# Patient Record
Sex: Female | Born: 1950 | Race: White | Hispanic: No | Marital: Single | Smoking: Never smoker
Health system: Southern US, Community
[De-identification: ages and names within clinical notes are randomized; demographics above are authoritative.]

## PROBLEM LIST (undated history)

## (undated) DIAGNOSIS — K222 Esophageal obstruction: Secondary | ICD-10-CM

## (undated) DIAGNOSIS — I10 Essential (primary) hypertension: Secondary | ICD-10-CM

## (undated) DIAGNOSIS — M1990 Unspecified osteoarthritis, unspecified site: Secondary | ICD-10-CM

## (undated) DIAGNOSIS — E785 Hyperlipidemia, unspecified: Secondary | ICD-10-CM

## (undated) HISTORY — PX: APPENDECTOMY: SHX54

## (undated) HISTORY — PX: OTHER SURGICAL HISTORY: SHX169

## (undated) HISTORY — PX: ABDOMINAL HYSTERECTOMY: SHX81

---

## 2019-05-18 ENCOUNTER — Emergency Department (HOSPITAL_COMMUNITY)
Admission: EM | Admit: 2019-05-18 | Discharge: 2019-05-18 | Disposition: A | Discharge status: Home / Self Care | Payer: Medicare Other | Attending: Emergency Medicine | Admitting: Emergency Medicine

## 2019-05-18 ENCOUNTER — Other Ambulatory Visit: Payer: Self-pay

## 2019-05-18 ENCOUNTER — Emergency Department (HOSPITAL_COMMUNITY): Payer: Medicare Other

## 2019-05-18 ENCOUNTER — Encounter (HOSPITAL_COMMUNITY): Payer: Self-pay

## 2019-05-18 DIAGNOSIS — Y998 Other external cause status: Secondary | ICD-10-CM | POA: Diagnosis not present

## 2019-05-18 DIAGNOSIS — Y93I9 Activity, other involving external motion: Secondary | ICD-10-CM | POA: Insufficient documentation

## 2019-05-18 DIAGNOSIS — I10 Essential (primary) hypertension: Secondary | ICD-10-CM | POA: Insufficient documentation

## 2019-05-18 DIAGNOSIS — Y92410 Unspecified street and highway as the place of occurrence of the external cause: Secondary | ICD-10-CM | POA: Insufficient documentation

## 2019-05-18 DIAGNOSIS — N2889 Other specified disorders of kidney and ureter: Secondary | ICD-10-CM | POA: Insufficient documentation

## 2019-05-18 DIAGNOSIS — S8992XA Unspecified injury of left lower leg, initial encounter: Secondary | ICD-10-CM | POA: Insufficient documentation

## 2019-05-18 DIAGNOSIS — S79912A Unspecified injury of left hip, initial encounter: Secondary | ICD-10-CM | POA: Diagnosis present

## 2019-05-18 DIAGNOSIS — R0789 Other chest pain: Secondary | ICD-10-CM | POA: Diagnosis not present

## 2019-05-18 DIAGNOSIS — E785 Hyperlipidemia, unspecified: Secondary | ICD-10-CM | POA: Insufficient documentation

## 2019-05-18 HISTORY — DX: Hyperlipidemia, unspecified: E78.5

## 2019-05-18 HISTORY — DX: Essential (primary) hypertension: I10

## 2019-05-18 HISTORY — DX: Unspecified osteoarthritis, unspecified site: M19.90

## 2019-05-18 LAB — BASIC METABOLIC PANEL
Anion gap: 11 (ref 5–15)
BUN: 20 mg/dL (ref 8–23)
CO2: 24 mmol/L (ref 22–32)
Calcium: 9.5 mg/dL (ref 8.9–10.3)
Chloride: 105 mmol/L (ref 98–111)
Creatinine, Ser: 0.88 mg/dL (ref 0.44–1.00)
GFR calc Af Amer: >60 mL/min (ref >60)
GFR calc non Af Amer: >60 mL/min (ref >60)
Glucose, Bld: 119 mg/dL — ABNORMAL HIGH (ref 70–99)
Potassium: 3.5 mmol/L (ref 3.5–5.1)
Sodium: 140 mmol/L (ref 135–145)

## 2019-05-18 LAB — CBC WITH DIFFERENTIAL/PLATELET
Abs Immature Granulocytes: 0.11 10*3/uL — ABNORMAL HIGH (ref 0.00–0.07)
Basophils Absolute: 0.1 10*3/uL (ref 0.0–0.1)
Basophils Relative: 1 %
Eosinophils Absolute: 0.1 10*3/uL (ref 0.0–0.5)
Eosinophils Relative: 1 %
HCT: 40.4 % (ref 36.0–46.0)
Hemoglobin: 12.9 g/dL (ref 12.0–15.0)
Immature Granulocytes: 1 %
Lymphocytes Relative: 14 %
Lymphs Abs: 1.8 10*3/uL (ref 0.7–4.0)
MCH: 30.2 pg (ref 26.0–34.0)
MCHC: 31.9 g/dL (ref 30.0–36.0)
MCV: 94.6 fL (ref 80.0–100.0)
Monocytes Absolute: 0.9 10*3/uL (ref 0.1–1.0)
Monocytes Relative: 6 %
Neutro Abs: 10.3 10*3/uL — ABNORMAL HIGH (ref 1.7–7.7)
Neutrophils Relative %: 77 %
Platelets: 288 10*3/uL (ref 150–400)
RBC: 4.27 MIL/uL (ref 3.87–5.11)
RDW: 13.3 % (ref 11.5–15.5)
WBC: 13.2 10*3/uL — ABNORMAL HIGH (ref 4.0–10.5)
nRBC: 0 % (ref 0.0–0.2)

## 2019-05-18 MED ORDER — MORPHINE SULFATE (PF) 4 MG/ML IV SOLN
4.0000 mg | Freq: Once | INTRAVENOUS | Status: AC
  Administered 2019-05-18: 17:00:00 4 mg via INTRAVENOUS
  Filled 2019-05-18: qty 1

## 2019-05-18 MED ORDER — METHOCARBAMOL 500 MG PO TABS: 500.0000 mg | ORAL_TABLET | Freq: Two times a day (BID) | ORAL | 0 refills | Status: DC

## 2019-05-18 MED ORDER — IOHEXOL 300 MG/ML  SOLN
100.0000 mL | Freq: Once | INTRAMUSCULAR | Status: AC | PRN
  Administered 2019-05-18: 18:00:00 100 mL via INTRAVENOUS

## 2019-05-18 MED ORDER — HYDROCODONE-ACETAMINOPHEN 5-325 MG PO TABS: 1.0000 | ORAL_TABLET | ORAL | 0 refills | Status: DC | PRN

## 2019-05-18 MED ORDER — SODIUM CHLORIDE 0.9 % IV SOLN
INTRAVENOUS | Status: DC
  Administered 2019-05-18: 16:00:00 1000 mL via INTRAVENOUS

## 2019-05-18 MED ORDER — ONDANSETRON HCL 4 MG/2ML IJ SOLN
4.0000 mg | Freq: Once | INTRAMUSCULAR | Status: AC
  Administered 2019-05-18: 17:00:00 4 mg via INTRAVENOUS
  Filled 2019-05-18: qty 2
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
## 2019-05-18 NOTE — ED Notes (Signed)
Nurse navigator spoke with son Coralyn Mark who called and patient. Currently waiting for plan of care of admission or discharge. Son waiting in parking lot and will continue to wait until called back.
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
## 2019-05-18 NOTE — ED Notes (Signed)
Patient transported to X-ray 

## 2019-05-18 NOTE — ED Notes (Signed)
Per CT tech, IV noted to be infiltrated while pt at CT prior to receiving contrast; IV discontinued, new IV placed by CT tech; first IV site (LAC) noted to be warm and swollen upon pt's return to room; not painful; pharmacy notified, cold compress placed on site per pharmacy

## 2019-05-18 NOTE — Discharge Instructions (Signed)
You have a mass on your right kidney that is highly suspicious for renal cell cancer.  Follow-up with urology for further evaluation.

## 2019-05-21 ENCOUNTER — Other Ambulatory Visit: Payer: Self-pay | Admitting: Urology

## 2019-07-06 ENCOUNTER — Other Ambulatory Visit (HOSPITAL_COMMUNITY)
Admission: RE | Admit: 2019-07-06 | Discharge: 2019-07-06 | Disposition: A | Discharge status: Home / Self Care | Payer: Medicare Other | Source: Ambulatory Visit | Attending: Urology | Admitting: Urology

## 2019-07-06 ENCOUNTER — Other Ambulatory Visit (HOSPITAL_COMMUNITY): Payer: Self-pay

## 2019-07-06 DIAGNOSIS — Z1159 Encounter for screening for other viral diseases: Secondary | ICD-10-CM | POA: Insufficient documentation

## 2019-07-06 LAB — SARS CORONAVIRUS 2 (TAT 6-24 HRS): SARS Coronavirus 2: NEGATIVE

## 2019-07-06 NOTE — Patient Instructions (Addendum)
YOU HADA COVID 19 TEST ON 07-06-2019.  ONCE YOUR COVID TEST IS COMPLETED, PLEASE BEGIN THE QUARANTINE INSTRUCTIONS AS OUTLINED IN YOUR HANDOUT.                Laurie Savage     Your procedure is scheduled on: 07-09-2019  Report to [facility] Main  Entrance                  Report to [place] at 530  AM      Call this number if you have problems the morning of surgery [phone]    Remember: Do not eat food  :After Midnight Wednesday NIGHT, CLEAR LIQUIDS ALL DAY Thursday 07-07-3029. NO CLEAR LIQUIDS AFTER MIDNIGHT Thursday NIGHT. FOLLOW ALL BOWEL PREP INSTRUCTIONS FROM DR [facility].   BRUSH YOUR TEETH MORNING OF SURGERY AND RINSE YOUR MOUTH OUT, NO CHEWING GUM CANDY OR MINTS.      CLEAR LIQUID DIET   Foods Allowed                                                                     Foods Excluded  Coffee and tea, regular and decaf                             liquids that you cannot  Plain Jell-O in any flavor                                             see through such as: Fruit ices (not with fruit pulp)                                     milk, soups, orange juice  Iced Popsicles                                    All solid food Carbonated beverages, regular and diet                                    Cranberry, grape and apple juices Sports drinks like Gatorade Lightly seasoned clear broth or consume(fat free) Sugar, honey syrup  Sample Menu Breakfast                                Lunch                                     Supper Cranberry juice                    Beef broth                            Chicken  broth Jell-O                                     Grape juice                           Apple juice Coffee or tea                        Jell-O                                      Popsicle                                                Coffee or tea                        Coffee or  tea  _____________________________________________________________________    Take these medicines the morning of surgery with A SIP OF WATER: NONE              You may not have any metal on your body including hair pins and              piercings  Do not wear jewelry, make-up, lotions, powders or perfumes, deodorant             Do not wear nail polish.  Do not shave  48 hours prior to surgery.                 Do not bring valuables to the hospital. [place].  Contacts, dentures or bridgework may not be worn into surgery.     _____________________________________________________________________           [facility] - Preparing for Surgery Before surgery, you can play an important role.  Because skin is not sterile, your skin needs to be as free of germs as possible.  You can reduce the number of germs on your skin by washing with CHG (chlorahexidine gluconate) soap before surgery.  CHG is an antiseptic cleaner which kills germs and bonds with the skin to continue killing germs even after washing. Please DO NOT use if you have an allergy to CHG or antibacterial soaps.  If your skin becomes reddened/irritated stop using the CHG and inform your nurse when you arrive at Short Stay. Do not shave (including legs and underarms) for at least 48 hours prior to the first CHG shower.  You may shave your face/neck. Please follow these instructions carefully:  1.  Shower with CHG Soap the night before surgery and the  morning of Surgery.  2.  If you choose to wash your hair, wash your hair first as usual with your  normal  shampoo.  3.  After you shampoo, rinse your hair and body thoroughly to remove the  shampoo.                           4.  Use CHG as you would  any other liquid soap.  You can apply chg directly  to the skin and wash                       Gently with a scrungie or clean washcloth.  5.  Apply the CHG Soap to your body ONLY FROM THE  NECK DOWN.   Do not use on face/ open                           Wound or open sores. Avoid contact with eyes, ears mouth and genitals (private parts).                       Wash face,  Genitals (private parts) with your normal soap.             6.  Wash thoroughly, paying special attention to the area where your surgery  will be performed.  7.  Thoroughly rinse your body with warm water from the neck down.  8.  DO NOT shower/wash with your normal soap after using and rinsing off  the CHG Soap.                9.  Pat yourself dry with a clean towel.            10.  Wear clean pajamas.            11.  Place clean sheets on your bed the night of your first shower and do not  sleep with pets. Day of Surgery : Do not apply any lotions/deodorants the morning of surgery.  Please wear clean clothes to the hospital/surgery center.  FAILURE TO FOLLOW THESE INSTRUCTIONS MAY RESULT IN THE CANCELLATION OF YOUR SURGERY PATIENT SIGNATURE_________________________________  NURSE SIGNATURE__________________________________  ________________________________________________________________________

## 2019-07-06 NOTE — Progress Notes (Signed)
CHEST CT WITH CONTRAST 05-18-19 Epic EKG 05-18-19 EPIC

## 2019-07-07 ENCOUNTER — Other Ambulatory Visit: Payer: Self-pay

## 2019-07-07 ENCOUNTER — Encounter (HOSPITAL_COMMUNITY): Payer: Self-pay

## 2019-07-07 ENCOUNTER — Encounter (HOSPITAL_COMMUNITY)
Admission: RE | Admit: 2019-07-07 | Discharge: 2019-07-07 | Disposition: A | Discharge status: Home / Self Care | Payer: Medicare Other | Source: Ambulatory Visit | Attending: Urology | Admitting: Urology

## 2019-07-07 HISTORY — DX: Esophageal obstruction: K22.2

## 2019-07-07 LAB — CBC
HCT: 40.1 % (ref 36.0–46.0)
Hemoglobin: 12.7 g/dL (ref 12.0–15.0)
MCH: 31.6 pg (ref 26.0–34.0)
MCHC: 31.7 g/dL (ref 30.0–36.0)
MCV: 99.8 fL (ref 80.0–100.0)
Platelets: 319 10*3/uL (ref 150–400)
RBC: 4.02 MIL/uL (ref 3.87–5.11)
RDW: 13.9 % (ref 11.5–15.5)
WBC: 6.1 10*3/uL (ref 4.0–10.5)
nRBC: 0 % (ref 0.0–0.2)

## 2019-07-07 LAB — BASIC METABOLIC PANEL
Anion gap: 10 (ref 5–15)
BUN: 20 mg/dL (ref 8–23)
CO2: 26 mmol/L (ref 22–32)
Calcium: 9.4 mg/dL (ref 8.9–10.3)
Chloride: 106 mmol/L (ref 98–111)
Creatinine, Ser: 0.73 mg/dL (ref 0.44–1.00)
GFR calc Af Amer: >60 mL/min (ref >60)
GFR calc non Af Amer: >60 mL/min (ref >60)
Glucose, Bld: 104 mg/dL — ABNORMAL HIGH (ref 70–99)
Potassium: 4.1 mmol/L (ref 3.5–5.1)
Sodium: 142 mmol/L (ref 135–145)

## 2019-07-08 MED ORDER — DEXTROSE 5 % IV SOLN
3.0000 g | INTRAVENOUS | Status: AC
  Administered 2019-07-09: 3 g via INTRAVENOUS
  Filled 2019-07-08: qty 3

## 2019-07-08 NOTE — Progress Notes (Signed)
Anesthesia Chart Review:   Case: [id number] Date/Time: 07/09/19 0700   Procedure: XI ROBOTIC ASSISTED LAPAROSCOPIC NEPHRECTOMY (Right ) - 3 HRS   Anesthesia type: General   Pre-op diagnosis: RIGHT RENAL MASS   Location: [facility] ROOM 03 / [facility]   Surgeon: Cleon Gustin, MD      DISCUSSION:  Pt is a 68 year old female with hx HTN  VS: BP (!) 188/66   Pulse 96   Temp 37.1 C (Oral)   Resp 16   Ht 5\' 4"  (1.626 m)   Wt 120.2 kg   SpO2 96%   BMI 45.50 kg/m    - BP elevated at presurgical testing. Usually controlled; BP 111/65 at last PCP visit 05/25/19 (notes in care everywhere). Pt anxious about surgery.    PROVIDERS: - PCP is Maylon Peppers, MD (notes in care everywhere)    LABS: Labs reviewed: Acceptable for surgery. (all labs ordered are listed, but only abnormal results are displayed)  Labs Reviewed  BASIC METABOLIC PANEL - Abnormal; Notable for the following components:      Result Value   Glucose, Bld 104 (*)    All other components within normal limits  CBC     IMAGES:  CT chest/abd/pelvis 05/18/19:  1. No evidence of significant acute traumatic injury to the chest, abdomen or pelvis. 2. However, there is a heterogeneously enhancing 4.0 x 3.4 x 4.5 cm right renal mass highly suspicious for a renal cell carcinoma. At this time, this is likely encapsulated within Gerota's fascia, does not extend into the right renal vein, and is not associated with lymphadenopathy or definite metastatic disease in the chest, abdomen or pelvis. 3. Aortic atherosclerosis. 4. 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 5. Severe colonic diverticulosis without evidence of acute diverticulitis at this time. 6. Additional incidental findings, as above.   EKG 07/07/19: Sinus rhythm with occasional PVCs. Left axis deviation. Nonspecific ST abnormality. Left atrial abnormality   Past Medical History:  Diagnosis Date  . Arthritis    . Esophageal stricture    stretched about once a year  . Hyperlipidemia   . Hypertension     Past Surgical History:  Procedure Laterality Date  . ABDOMINAL HYSTERECTOMY     partial  . APPENDECTOMY    . CESAREAN SECTION    . removal naval     due to hernia [phone]    MEDICATIONS: . lisinopril (ZESTRIL) 10 MG tablet   No current facility-administered medications for this encounter.    Derrill Memo ON 07/09/2019] ceFAZolin (ANCEF) 3 g in dextrose 5 % 50 mL IVPB    If BP acceptable day of surgery, I anticipate pt can proceed with surgery as scheduled.  Willeen Cass, FNP-BC [facility] Short Stay Surgical Center/Anesthesiology Phone: [phone] 07/08/2019 12:44 PM

## 2019-07-08 NOTE — Anesthesia Preprocedure Evaluation (Addendum)
Anesthesia Evaluation    Reviewed: Allergy & Precautions, Patient's Chart, lab work & pertinent test results  Airway Mallampati: III  TM Distance: >3 FB Neck ROM: Full    Dental  (+) Teeth Intact, Dental Advisory Given, Missing   Pulmonary neg pulmonary ROS,    Pulmonary exam normal breath sounds clear to auscultation       Cardiovascular hypertension, Pt. on medications (-) angina(-) CAD and (-) Past MI Normal cardiovascular exam Rhythm:Regular Rate:Normal     Neuro/Psych negative neurological ROS     GI/Hepatic Neg liver ROS, Esophageal stricture   Endo/Other  Morbid obesity  Renal/GU negative Renal ROS     Musculoskeletal  (+) Arthritis ,   Abdominal   Peds  Hematology negative hematology ROS (+)   Anesthesia Other Findings Day of surgery medications reviewed with the patient.  Reproductive/Obstetrics                           Anesthesia Physical Anesthesia Plan  ASA: III  Anesthesia Plan: General   Post-op Pain Management:    Induction: Intravenous  PONV Risk Score and Plan: 4 or greater and Diphenhydramine, Scopolamine patch - Pre-op, Midazolam, Dexamethasone and Ondansetron  Airway Management Planned: Oral ETT  Additional Equipment:   Intra-op Plan:   Post-operative Plan: Extubation in OR  Informed Consent: I have reviewed the patients History and Physical, chart, labs and discussed the procedure including the risks, benefits and alternatives for the proposed anesthesia with the patient or authorized representative who has indicated his/her understanding and acceptance.     Dental advisory given  Plan Discussed with: CRNA  Anesthesia Plan Comments: (See APP note by Willeen Cass, FNP  2nd PIV after induction)      Anesthesia Quick Evaluation

## 2019-07-09 ENCOUNTER — Other Ambulatory Visit: Payer: Self-pay

## 2019-07-09 ENCOUNTER — Inpatient Hospital Stay (HOSPITAL_COMMUNITY)
Admission: RE | Admit: 2019-07-09 | Discharge: 2019-07-10 | DRG: 657 | Disposition: A | Discharge status: Home / Self Care | Payer: Medicare Other | Attending: Urology | Admitting: Urology

## 2019-07-09 ENCOUNTER — Inpatient Hospital Stay (HOSPITAL_COMMUNITY): Payer: Medicare Other | Admitting: Certified Registered Nurse Anesthetist

## 2019-07-09 ENCOUNTER — Encounter (HOSPITAL_COMMUNITY): Payer: Self-pay | Admitting: Emergency Medicine

## 2019-07-09 ENCOUNTER — Inpatient Hospital Stay (HOSPITAL_COMMUNITY): Payer: Medicare Other | Admitting: Emergency Medicine

## 2019-07-09 ENCOUNTER — Encounter (HOSPITAL_COMMUNITY)
Admission: RE | Disposition: A | Discharge status: Home / Self Care | Payer: Self-pay | Source: Home / Self Care | Attending: Urology

## 2019-07-09 DIAGNOSIS — K660 Peritoneal adhesions (postprocedural) (postinfection): Secondary | ICD-10-CM | POA: Diagnosis present

## 2019-07-09 DIAGNOSIS — Z98891 History of uterine scar from previous surgery: Secondary | ICD-10-CM | POA: Diagnosis not present

## 2019-07-09 DIAGNOSIS — Z9049 Acquired absence of other specified parts of digestive tract: Secondary | ICD-10-CM

## 2019-07-09 DIAGNOSIS — Z79899 Other long term (current) drug therapy: Secondary | ICD-10-CM

## 2019-07-09 DIAGNOSIS — Z882 Allergy status to sulfonamides status: Secondary | ICD-10-CM

## 2019-07-09 DIAGNOSIS — I493 Ventricular premature depolarization: Secondary | ICD-10-CM | POA: Diagnosis present

## 2019-07-09 DIAGNOSIS — C641 Malignant neoplasm of right kidney, except renal pelvis: Secondary | ICD-10-CM | POA: Diagnosis present

## 2019-07-09 DIAGNOSIS — Z6841 Body Mass Index (BMI) 40.0 and over, adult: Secondary | ICD-10-CM | POA: Diagnosis not present

## 2019-07-09 DIAGNOSIS — N2889 Other specified disorders of kidney and ureter: Secondary | ICD-10-CM | POA: Diagnosis present

## 2019-07-09 DIAGNOSIS — I10 Essential (primary) hypertension: Secondary | ICD-10-CM | POA: Diagnosis present

## 2019-07-09 DIAGNOSIS — Z90711 Acquired absence of uterus with remaining cervical stump: Secondary | ICD-10-CM

## 2019-07-09 HISTORY — PX: ROBOT ASSISTED LAPAROSCOPIC NEPHRECTOMY: SHX5140

## 2019-07-09 LAB — TYPE AND SCREEN
ABO/RH(D): B POS
Antibody Screen: NEGATIVE

## 2019-07-09 LAB — HEMOGLOBIN AND HEMATOCRIT, BLOOD
HCT: 42.2 % (ref 36.0–46.0)
Hemoglobin: 13 g/dL (ref 12.0–15.0)

## 2019-07-09 LAB — ABO/RH: ABO/RH(D): B POS

## 2019-07-09 SURGERY — NEPHRECTOMY, RADICAL, ROBOT-ASSISTED, LAPAROSCOPIC, ADULT
Anesthesia: General | Site: Abdomen | Laterality: Right

## 2019-07-09 MED ORDER — HYDRALAZINE HCL 20 MG/ML IJ SOLN
INTRAMUSCULAR | Status: AC
  Filled 2019-07-09: qty 1

## 2019-07-09 MED ORDER — SUCCINYLCHOLINE CHLORIDE 200 MG/10ML IV SOSY
PREFILLED_SYRINGE | INTRAVENOUS | Status: DC | PRN
  Administered 2019-07-09: 120 mg via INTRAVENOUS

## 2019-07-09 MED ORDER — ROCURONIUM BROMIDE 10 MG/ML (PF) SYRINGE
PREFILLED_SYRINGE | INTRAVENOUS | Status: DC | PRN
  Administered 2019-07-09: 30 mg via INTRAVENOUS
  Administered 2019-07-09: 70 mg via INTRAVENOUS

## 2019-07-09 MED ORDER — STERILE WATER FOR IRRIGATION IR SOLN
Status: DC | PRN
  Administered 2019-07-09: 1000 mL

## 2019-07-09 MED ORDER — HYDRALAZINE HCL 20 MG/ML IJ SOLN
10.0000 mg | Freq: Once | INTRAMUSCULAR | Status: AC
  Administered 2019-07-09: 12:00:00 10 mg via INTRAVENOUS

## 2019-07-09 MED ORDER — FENTANYL CITRATE (PF) 250 MCG/5ML IJ SOLN
INTRAMUSCULAR | Status: AC
  Filled 2019-07-09: qty 5

## 2019-07-09 MED ORDER — ACETAMINOPHEN 10 MG/ML IV SOLN
1000.0000 mg | Freq: Four times a day (QID) | INTRAVENOUS | Status: AC
  Administered 2019-07-09 – 2019-07-10 (×4): 1000 mg via INTRAVENOUS
  Filled 2019-07-09 (×4): qty 100

## 2019-07-09 MED ORDER — LIDOCAINE HCL (CARDIAC) PF 100 MG/5ML IV SOSY
PREFILLED_SYRINGE | INTRAVENOUS | Status: DC | PRN
  Administered 2019-07-09: 100 mg via INTRAVENOUS

## 2019-07-09 MED ORDER — LACTATED RINGERS IR SOLN
Status: DC | PRN
  Administered 2019-07-09: 1000 mL

## 2019-07-09 MED ORDER — SUCCINYLCHOLINE CHLORIDE 200 MG/10ML IV SOSY
PREFILLED_SYRINGE | INTRAVENOUS | Status: AC
  Filled 2019-07-09: qty 10

## 2019-07-09 MED ORDER — BELLADONNA ALKALOIDS-OPIUM 16.2-60 MG RE SUPP: 1.0000 | Freq: Four times a day (QID) | RECTAL | Status: DC | PRN

## 2019-07-09 MED ORDER — ROCURONIUM BROMIDE 10 MG/ML (PF) SYRINGE
PREFILLED_SYRINGE | INTRAVENOUS | Status: AC
  Filled 2019-07-09: qty 10

## 2019-07-09 MED ORDER — LISINOPRIL 10 MG PO TABS
10.0000 mg | ORAL_TABLET | Freq: Every day | ORAL | Status: DC
  Administered 2019-07-10: 10 mg via ORAL
  Filled 2019-07-09: qty 1

## 2019-07-09 MED ORDER — BUPIVACAINE LIPOSOME 1.3 % IJ SUSP
20.0000 mL | Freq: Once | INTRAMUSCULAR | Status: AC
  Administered 2019-07-09: 20 mL
  Filled 2019-07-09: qty 20

## 2019-07-09 MED ORDER — HYDRALAZINE HCL 20 MG/ML IJ SOLN
10.0000 mg | Freq: Four times a day (QID) | INTRAMUSCULAR | Status: DC | PRN
  Administered 2019-07-09: 10 mg via INTRAVENOUS
  Filled 2019-07-09: qty 1

## 2019-07-09 MED ORDER — ONDANSETRON HCL 4 MG/2ML IJ SOLN
INTRAMUSCULAR | Status: AC
  Filled 2019-07-09: qty 2

## 2019-07-09 MED ORDER — GABAPENTIN 300 MG PO CAPS
300.0000 mg | ORAL_CAPSULE | Freq: Once | ORAL | Status: AC
  Administered 2019-07-09: 06:00:00 300 mg via ORAL
  Filled 2019-07-09: qty 1

## 2019-07-09 MED ORDER — MIDAZOLAM HCL 2 MG/2ML IJ SOLN
INTRAMUSCULAR | Status: AC
  Filled 2019-07-09: qty 2

## 2019-07-09 MED ORDER — SUGAMMADEX SODIUM 500 MG/5ML IV SOLN
INTRAVENOUS | Status: DC | PRN
  Administered 2019-07-09: 250 mg via INTRAVENOUS

## 2019-07-09 MED ORDER — SCOPOLAMINE 1 MG/3DAYS TD PT72
1.0000 | MEDICATED_PATCH | Freq: Once | TRANSDERMAL | Status: DC
  Filled 2019-07-09: qty 1

## 2019-07-09 MED ORDER — DIPHENHYDRAMINE HCL 50 MG/ML IJ SOLN: 12.5000 mg | Freq: Four times a day (QID) | INTRAMUSCULAR | Status: DC | PRN

## 2019-07-09 MED ORDER — LACTATED RINGERS IV SOLN
INTRAVENOUS | Status: DC | PRN
  Administered 2019-07-09 (×2): via INTRAVENOUS

## 2019-07-09 MED ORDER — PROPOFOL 10 MG/ML IV BOLUS
INTRAVENOUS | Status: DC | PRN
  Administered 2019-07-09: 150 mg via INTRAVENOUS

## 2019-07-09 MED ORDER — MAGNESIUM CITRATE PO SOLN: 1.0000 | Freq: Once | ORAL | Status: DC

## 2019-07-09 MED ORDER — ONDANSETRON HCL 4 MG/2ML IJ SOLN
INTRAMUSCULAR | Status: DC | PRN
  Administered 2019-07-09: 4 mg via INTRAVENOUS

## 2019-07-09 MED ORDER — HYDROCODONE-ACETAMINOPHEN 5-325 MG PO TABS: 1.0000 | ORAL_TABLET | Freq: Four times a day (QID) | ORAL | 0 refills | Status: DC | PRN

## 2019-07-09 MED ORDER — LACTATED RINGERS IV SOLN
INTRAVENOUS | Status: DC
  Administered 2019-07-09: 06:00:00 via INTRAVENOUS

## 2019-07-09 MED ORDER — OXYCODONE HCL 5 MG PO TABS
5.0000 mg | ORAL_TABLET | ORAL | Status: DC | PRN
  Administered 2019-07-09 – 2019-07-10 (×3): 5 mg via ORAL
  Filled 2019-07-09 (×3): qty 1

## 2019-07-09 MED ORDER — ACETAMINOPHEN 500 MG PO TABS
1000.0000 mg | ORAL_TABLET | Freq: Once | ORAL | Status: AC
  Administered 2019-07-09: 1000 mg via ORAL
  Filled 2019-07-09: qty 2

## 2019-07-09 MED ORDER — EPHEDRINE 5 MG/ML INJ
INTRAVENOUS | Status: AC
  Filled 2019-07-09: qty 10

## 2019-07-09 MED ORDER — DEXAMETHASONE SODIUM PHOSPHATE 10 MG/ML IJ SOLN
INTRAMUSCULAR | Status: DC | PRN
  Administered 2019-07-09: 8 mg via INTRAVENOUS

## 2019-07-09 MED ORDER — PROMETHAZINE HCL 25 MG/ML IJ SOLN: 6.2500 mg | INTRAMUSCULAR | Status: DC | PRN

## 2019-07-09 MED ORDER — MIDAZOLAM HCL 2 MG/2ML IJ SOLN
INTRAMUSCULAR | Status: DC | PRN
  Administered 2019-07-09 (×2): 1 mg via INTRAVENOUS

## 2019-07-09 MED ORDER — SODIUM CHLORIDE (PF) 0.9 % IJ SOLN
INTRAMUSCULAR | Status: AC
  Filled 2019-07-09: qty 20

## 2019-07-09 MED ORDER — SODIUM CHLORIDE (PF) 0.9 % IJ SOLN
INTRAMUSCULAR | Status: DC | PRN
  Administered 2019-07-09: 20 mL via INTRAVENOUS

## 2019-07-09 MED ORDER — HYDROMORPHONE HCL 1 MG/ML IJ SOLN
0.5000 mg | INTRAMUSCULAR | Status: DC | PRN
  Administered 2019-07-09 – 2019-07-10 (×2): 1 mg via INTRAVENOUS
  Filled 2019-07-09 (×2): qty 1

## 2019-07-09 MED ORDER — LIDOCAINE 2% (20 MG/ML) 5 ML SYRINGE
INTRAMUSCULAR | Status: AC
  Filled 2019-07-09: qty 5

## 2019-07-09 MED ORDER — SUGAMMADEX SODIUM 500 MG/5ML IV SOLN
INTRAVENOUS | Status: AC
  Filled 2019-07-09: qty 5

## 2019-07-09 MED ORDER — SODIUM CHLORIDE 0.9 % IV SOLN
INTRAVENOUS | Status: DC | PRN
  Administered 2019-07-09: 08:00:00 25 ug/min via INTRAVENOUS

## 2019-07-09 MED ORDER — FENTANYL CITRATE (PF) 250 MCG/5ML IJ SOLN
INTRAMUSCULAR | Status: DC | PRN
  Administered 2019-07-09: 100 ug via INTRAVENOUS

## 2019-07-09 MED ORDER — FENTANYL CITRATE (PF) 100 MCG/2ML IJ SOLN
25.0000 ug | INTRAMUSCULAR | Status: DC | PRN
  Administered 2019-07-09 (×3): 25 ug via INTRAVENOUS

## 2019-07-09 MED ORDER — HYDRALAZINE HCL 20 MG/ML IJ SOLN
10.0000 mg | Freq: Once | INTRAMUSCULAR | Status: AC
  Administered 2019-07-09: 11:00:00 10 mg via INTRAVENOUS

## 2019-07-09 MED ORDER — FENTANYL CITRATE (PF) 100 MCG/2ML IJ SOLN
INTRAMUSCULAR | Status: AC
  Filled 2019-07-09: qty 2

## 2019-07-09 MED ORDER — DIPHENHYDRAMINE HCL 50 MG/ML IJ SOLN
INTRAMUSCULAR | Status: AC
  Filled 2019-07-09: qty 1

## 2019-07-09 MED ORDER — DEXTROSE-NACL 5-0.45 % IV SOLN
INTRAVENOUS | Status: DC
  Administered 2019-07-09 (×2): via INTRAVENOUS

## 2019-07-09 MED ORDER — ONDANSETRON HCL 4 MG/2ML IJ SOLN: 4.0000 mg | INTRAMUSCULAR | Status: DC | PRN

## 2019-07-09 MED ORDER — PROPOFOL 10 MG/ML IV BOLUS
INTRAVENOUS | Status: AC
  Filled 2019-07-09: qty 40

## 2019-07-09 MED ORDER — DIPHENHYDRAMINE HCL 12.5 MG/5ML PO ELIX: 12.5000 mg | ORAL_SOLUTION | Freq: Four times a day (QID) | ORAL | Status: DC | PRN

## 2019-07-09 MED ORDER — DOCUSATE SODIUM 100 MG PO CAPS
100.0000 mg | ORAL_CAPSULE | Freq: Two times a day (BID) | ORAL | Status: DC
  Administered 2019-07-09 – 2019-07-10 (×2): 100 mg via ORAL
  Filled 2019-07-09 (×2): qty 1

## 2019-07-09 MED ORDER — EPHEDRINE SULFATE-NACL 50-0.9 MG/10ML-% IV SOSY
PREFILLED_SYRINGE | INTRAVENOUS | Status: DC | PRN
  Administered 2019-07-09 (×2): 5 mg via INTRAVENOUS

## 2019-07-09 SURGICAL SUPPLY — 58 items
BAG LAPAROSCOPIC 12 15 PORT 16 (BASKET) IMPLANT
BAG RETRIEVAL 12/15 (BASKET) ×2
BAG RETRIEVAL 12/15MM (BASKET) ×1
CHLORAPREP W/TINT 26 (MISCELLANEOUS) ×3 IMPLANT
CLIP VESOLOCK LG 6/CT PURPLE (CLIP) ×3 IMPLANT
CLIP VESOLOCK MED LG 6/CT (CLIP) ×3 IMPLANT
CLIP VESOLOCK XL 6/CT (CLIP) ×3 IMPLANT
COVER SURGICAL LIGHT HANDLE (MISCELLANEOUS) ×3 IMPLANT
COVER TIP SHEARS 8 DVNC (MISCELLANEOUS) ×1 IMPLANT
COVER TIP SHEARS 8MM DA VINCI (MISCELLANEOUS) ×2
COVER WAND RF STERILE (DRAPES) IMPLANT
CUTTER ECHEON FLEX ENDO 45 340 (ENDOMECHANICALS) ×2 IMPLANT
DECANTER SPIKE VIAL GLASS SM (MISCELLANEOUS) ×3 IMPLANT
DERMABOND ADVANCED (GAUZE/BANDAGES/DRESSINGS) ×4
DERMABOND ADVANCED .7 DNX12 (GAUZE/BANDAGES/DRESSINGS) ×2 IMPLANT
DRAPE ARM DVNC X/XI (DISPOSABLE) ×4 IMPLANT
DRAPE COLUMN DVNC XI (DISPOSABLE) ×1 IMPLANT
DRAPE DA VINCI XI ARM (DISPOSABLE) ×8
DRAPE DA VINCI XI COLUMN (DISPOSABLE) ×2
DRAPE INCISE IOBAN 66X45 STRL (DRAPES) ×3 IMPLANT
DRAPE SHEET LG 3/4 BI-LAMINATE (DRAPES) ×3 IMPLANT
ELECT PENCIL ROCKER SW 15FT (MISCELLANEOUS) ×3 IMPLANT
ELECT REM PT RETURN 15FT ADLT (MISCELLANEOUS) ×6 IMPLANT
GLOVE BIO SURGEON STRL SZ 6.5 (GLOVE) ×2 IMPLANT
GLOVE BIO SURGEON STRL SZ8 (GLOVE) ×6 IMPLANT
GLOVE BIO SURGEONS STRL SZ 6.5 (GLOVE) ×1
GLOVE BIOGEL PI IND STRL 8 (GLOVE) ×1 IMPLANT
GLOVE BIOGEL PI INDICATOR 8 (GLOVE) ×2
GOWN STRL REUS W/TWL LRG LVL3 (GOWN DISPOSABLE) ×9 IMPLANT
HEMOSTAT SURGICEL 4X8 (HEMOSTASIS) ×2 IMPLANT
IRRIG SUCT STRYKERFLOW 2 WTIP (MISCELLANEOUS) ×3
IRRIGATION SUCT STRKRFLW 2 WTP (MISCELLANEOUS) ×1 IMPLANT
KIT BASIN OR (CUSTOM PROCEDURE TRAY) ×3 IMPLANT
KIT TURNOVER KIT A (KITS) IMPLANT
LOOP VESSEL MAXI BLUE (MISCELLANEOUS) IMPLANT
NDL INSUFFLATION 14GA 120MM (NEEDLE) ×1 IMPLANT
NEEDLE INSUFFLATION 14GA 120MM (NEEDLE) ×3 IMPLANT
PROTECTOR NERVE ULNAR (MISCELLANEOUS) ×6 IMPLANT
RELOAD STAPLE 45 2.6 WHT THIN (STAPLE) IMPLANT
SEAL CANN UNIV 5-8 DVNC XI (MISCELLANEOUS) ×4 IMPLANT
SEAL XI 5MM-8MM UNIVERSAL (MISCELLANEOUS) ×8
SOLUTION ELECTROLUBE (MISCELLANEOUS) ×3 IMPLANT
SPONGE LAP 4X18 RFD (DISPOSABLE) IMPLANT
STAPLE RELOAD 45 WHT (STAPLE) ×2 IMPLANT
STAPLE RELOAD 45MM WHITE (STAPLE) ×4
SUT ETHILON 3 0 PS 1 (SUTURE) IMPLANT
SUT MNCRL AB 4-0 PS2 18 (SUTURE) ×6 IMPLANT
SUT PDS AB 1 TP1 96 (SUTURE) ×3 IMPLANT
SUT VIC AB 2-0 SH 27 (SUTURE) ×2
SUT VIC AB 2-0 SH 27X BRD (SUTURE) IMPLANT
SUT VICRYL 0 UR6 27IN ABS (SUTURE) ×6 IMPLANT
TAPE CLOTH 4X10 WHT NS (GAUZE/BANDAGES/DRESSINGS) ×3 IMPLANT
TOWEL OR NON WOVEN STRL DISP B (DISPOSABLE) ×3 IMPLANT
TRAY FOLEY MTR SLVR 16FR STAT (SET/KITS/TRAYS/PACK) IMPLANT
TRAY LAPAROSCOPIC (CUSTOM PROCEDURE TRAY) ×3 IMPLANT
TROCAR BLADELESS OPT 5 100 (ENDOMECHANICALS) ×3 IMPLANT
TROCAR XCEL 12X100 BLDLESS (ENDOMECHANICALS) ×3 IMPLANT
WATER STERILE IRR 1000ML POUR (IV SOLUTION) ×6 IMPLANT
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
## 2019-07-09 NOTE — Op Note (Signed)
Preoperative diagnosis: Right renal mass  Postop diagnosis: Same  Procedure: 1.  right robot assisted laparoscopic radical nephrectomy 2.  Lysis of adhesions,   Attending: Nicolette Bang, MD  Assistant: Debbrah Alar, MD  Resident: Ysidro Evert, MD  Anesthesia: General  Estimated blood loss: 100 cc  Drains: 16 French Foley catheter  Specimens: right radical nephrectomy  Antibiotics: ancef  Findings: 1 artery and 1 vein. Anterior abdominal wall adhesions requires 20 minutes to remove. The assistant utilized for retraction, suction and ligating the renal artery and vein  Indications: Patient is a 68 year old with a history of 4.5 cm right renal mass.  The mass was not amenable to partial nephrectomy.  After discussing treatment options patient decided to proceed with right robot assisted laparoscopic radical nephrectomy.  Procedure in detail: Prior to procedure consent was obtained. Patient was brought to the operating room and briefing was done sure correct patient, correct procedure, correct site.  General anesthesia was in administered patient was placed in the left lateral decubitus position.  a 13 French catheter was placed. their abdomen and flank was then prepped and draped usual sterile fashion.  A Veress needle was used to obtain pneumoperitoneum.  Once pneumoperitoneum was reestablished to 8 mmHg we then placed a 12 mm camera port lateral to the umbilicus at the lateral edge of rectus.  We then proceeded to place 3 more robotic ports. We then placed an assistant port inferior to the camera in the midline. We then placed a 27mm port for the liver retractor in the midline above the assistant port. We then docked the robot.  We then started this dissection by removing a extensive amount of anterior abdominal wall adhesions and adhesions to the liver.  We then dissected along the white line of [place].  We then reflected the colon medially.  We then proceeded to kocherize the duodenum.  We then identified the psoas muscle.  Once this was done we traced it down to the iliac vessels and identified the ureter.  Once we identified the gonadal vein and ureter were then traced this to the renal hilum.  The renal vein and renal artery were skeletonized.  We did we identified one renal vein one renal artery.  Using the [employer] power stapler within ligated the renal artery.  Once this was done we then used a second staple load to ligate the renal vein.  We then used a hem-o-lock clips to ligate the gonadal vein and the ureter.  Once this was done we then freed the kidney from its lateral and posterior attachments.  We then used a Endo Catch bag to remove the specimen.  Once the specimen was in the Endo Catch bag we then inspected the retroperitoneum and noted no residual bleeding.  We then removed our instruments, undocked the robot, and released the pneumoperitoneum.  We then made a right lower quardant incision to remove the specimen.  Once the specimen was removed we then closed the camera and assistant ports with 0 Vicryl in interrupted fashion.  We then closed the  Right lower quadrant incision with 0 PDS in a running fashion.  We then closed the overlying skin with 2-0 Vicryl in running fashion.  The skin was then closed with staples.  This then concluded the procedure which was well tolerated by the patient.  Complications: None  Condition: Stable, x-rayed, transferred to PACU.  Plan: Patient is to be admitted for inpatient stay. The foley catheter will be removed in the morning. They will  be started on a clear liquid diet POD#1

## 2019-07-09 NOTE — Progress Notes (Signed)
Dr. Gifford Shave notified of pts blood pressure today.  Also notified that pt has "borderline" eye pressure problems, but not an actual diagnosis of glaucoma. Per Dr. Gifford Shave, do not put scop patch on.

## 2019-07-09 NOTE — Transfer of Care (Signed)
Immediate Anesthesia Transfer of Care Note  Patient: Laurie Savage  Procedure(s) Performed: XI ROBOTIC ASSISTED LAPAROSCOPIC NEPHRECTOMY (Right Abdomen)  Patient Location: PACU  Anesthesia Type:General  Level of Consciousness: awake, alert , oriented and patient cooperative  Airway & Oxygen Therapy: Patient Spontanous Breathing and Patient connected to nasal cannula oxygen  Post-op Assessment: Report given to RN and Post -op Vital signs reviewed and stable  Post vital signs: Reviewed and stable  Last Vitals:  Vitals Value Taken Time  BP 178/114 07/09/19 1030  Temp    Pulse 58 07/09/19 1031  Resp 14 07/09/19 1031  SpO2 100 % 07/09/19 1031  Vitals shown include unvalidated device data.  Last Pain:  Vitals:   07/09/19 0545  TempSrc: Oral  PainSc:       Patients Stated Pain Goal: 4 (14/84/03 9795)  Complications: No apparent anesthesia complications

## 2019-07-09 NOTE — Discharge Instructions (Signed)

## 2019-07-09 NOTE — Progress Notes (Signed)
Extended into incision for kidney removal

## 2019-07-09 NOTE — Anesthesia Procedure Notes (Signed)
Procedure Name: Intubation Date/Time: 07/09/2019 7:51 AM Performed by: Raenette Rover, CRNA Pre-anesthesia Checklist: Patient identified, Emergency Drugs available, Suction available and Patient being monitored Patient Re-evaluated:Patient Re-evaluated prior to induction Oxygen Delivery Method: Circle system utilized Preoxygenation: Pre-oxygenation with 100% oxygen Induction Type: IV induction Ventilation: Mask ventilation without difficulty Laryngoscope Size: Mac and 3 Grade View: Grade I Tube type: Oral Tube size: 7.0 mm Number of attempts: 1 Airway Equipment and Method: Stylet Placement Confirmation: positive ETCO2,  CO2 detector,  breath sounds checked- equal and bilateral and ETT inserted through vocal cords under direct vision Secured at: 23 cm Tube secured with: Tape Dental Injury: Teeth and Oropharynx as per pre-operative assessment

## 2019-07-09 NOTE — Anesthesia Postprocedure Evaluation (Signed)
Anesthesia Post Note  Patient: Laurie Savage  Procedure(s) Performed: XI ROBOTIC ASSISTED LAPAROSCOPIC NEPHRECTOMY (Right Abdomen)     Patient location during evaluation: PACU Anesthesia Type: General Level of consciousness: awake and alert Pain management: pain level controlled Vital Signs Assessment: post-procedure vital signs reviewed and stable Respiratory status: spontaneous breathing, nonlabored ventilation, respiratory function stable and patient connected to nasal cannula oxygen Cardiovascular status: blood pressure returned to baseline, stable and bradycardic Postop Assessment: no apparent nausea or vomiting Anesthetic complications: no    Last Vitals:  Vitals:   07/09/19 1155 07/09/19 1200  BP: (!) 169/64 (!) 164/60  Pulse: (!) 51 (!) 53  Resp: 17 (!) 23  Temp:  (!) 36.3 C  SpO2: 98% 98%    Last Pain:  Vitals:   07/09/19 1145  TempSrc:   PainSc: Asleep                 Catalina Gravel

## 2019-07-10 ENCOUNTER — Encounter (HOSPITAL_COMMUNITY): Payer: Self-pay | Admitting: Urology

## 2019-07-10 LAB — BASIC METABOLIC PANEL
Anion gap: 7 (ref 5–15)
BUN: 15 mg/dL (ref 8–23)
CO2: 26 mmol/L (ref 22–32)
Calcium: 8.7 mg/dL — ABNORMAL LOW (ref 8.9–10.3)
Chloride: 103 mmol/L (ref 98–111)
Creatinine, Ser: 1.02 mg/dL — ABNORMAL HIGH (ref 0.44–1.00)
GFR calc Af Amer: >60 mL/min (ref >60)
GFR calc non Af Amer: 57 mL/min — ABNORMAL LOW (ref >60)
Glucose, Bld: 121 mg/dL — ABNORMAL HIGH (ref 70–99)
Potassium: 3.8 mmol/L (ref 3.5–5.1)
Sodium: 136 mmol/L (ref 135–145)

## 2019-07-10 LAB — HEMOGLOBIN AND HEMATOCRIT, BLOOD
HCT: 37.2 % (ref 36.0–46.0)
Hemoglobin: 11.4 g/dL — ABNORMAL LOW (ref 12.0–15.0)

## 2019-07-10 NOTE — Progress Notes (Signed)
Urology Progress Note   1 Day Post-Op s/p right radical nephrectomy  Subjective: NAEON. HTN better controlled. Got up to chair. Pain controlled  Objective: Vital signs in last 24 hours: Temp:  [96.8 F (36 C)-99.6 F (37.6 C)] 98.2 F (36.8 C) (07/18 0524) Pulse Rate:  [48-61] 57 (07/18 0524) Resp:  [14-28] 20 (07/18 0524) BP: (145-201)/(56-114) 145/63 (07/18 0524) SpO2:  [94 %-100 %] 97 % (07/18 0524)  Intake/Output from previous day: 07/17 0701 - 07/18 0700 In: 2904.3 [P.O.:120; I.V.:2684.3; IV Piggyback:100] Out: 575 [Urine:525; Blood:50] Intake/Output this shift: Total I/O In: 120 [P.O.:120] Out: 800 [Urine:800]  Physical Exam:  General: Alert and oriented CV: RRR Lungs: Clear Abdomen: Soft, appropriately tender. Brusing around incision sites GU: Foley in place draining clear yellow urine  Ext: NT, No erythema  Lab Results: Recent Labs    07/09/19 1051 07/10/19 0644  HGB 13.0 11.4*  HCT 42.2 37.2   BMET Recent Labs    07/10/19 0644  NA 136  K 3.8  CL 103  CO2 26  GLUCOSE 121*  BUN 15  CREATININE 1.02*  CALCIUM 8.7*     Studies/Results: No results found.  Assessment/Plan:  68 y.o. female s/p right radical nx.  Overall doing well post-op.   - d/c fluids - reg diet - d/c foley - home in PM if meeting discharge goals   Dispo: floor   LOS: 1 day   Tharon Aquas 07/10/2019, 9:47 AM

## 2019-07-10 NOTE — Discharge Summary (Signed)
[facility] Discharge Summary  Admit date: 07/09/2019  Discharge date and time: 07/10/19   Discharge to: Home  Discharge Service: Urology  Discharge Attending Physician:  Louis Meckel [facility] Surgeon)  Discharge  Diagnoses:  Renal Mass   OR Procedures: Procedure(s): XI ROBOTIC ASSISTED LAPAROSCOPIC NEPHRECTOMY 07/09/2019   Ancillary Procedures: None   Discharge Day Services: The patient was seen and examined by the Urology team both in the morning and immediately prior to discharge.  Vital signs and laboratory values were stable and within normal limits.  The physical exam was benign and unchanged and all surgical wounds were examined.  Discharge instructions were explained and all questions answered.  Subjective  No acute events overnight. Pain Controlled. No fever or chills.  Objective Patient Vitals for the past 8 hrs:  BP Temp Temp src Pulse Resp SpO2  07/10/19 1320 (!) 192/70 98.6 F (37 C) Oral 82 20 97 %   Total I/O In: 360 [P.O.:360] Out: 1100 [Urine:1100]  General Appearance:        No acute distress Lungs:                       Normal work of breathing on room air Heart:                                Regular rate and rhythm Abdomen:                         Soft, non-tender, non-distended. Brusing around extraction site but no hematoma.  GU:    Voiding volitionally  Extremities:                      Warm and well perfused   Hospital Course:  The patient underwent right robotic radical nephrectomy on 07/09/2019.  The patient tolerated the procedure well, was extubated in the OR, and afterwards was taken to the PACU for routine post-surgical care. When stable the patient was transferred to the floor.     The patient did well postoperatively. She had significant HTN preop and post op that was managed with hydralazine.  She is asymptomatic from this.   The patient's diet was slowly advanced and at the time of discharge was tolerating a regular diet.  The patient  was discharged home 1 Day Post-Op, at which point was tolerating a regular solid diet, was able to void spontaneously, have adequate pain control with P.O. pain medication, and could ambulate without difficulty. The patient will follow up with [place] for post op check.  She will follow-up with her primary care physician next week for hypertension follow-up.  Return precautions given for headache, blurry vision, neurologic changes  Condition at Discharge: Improved  Discharge Medications:  Allergies as of 07/10/2019      Reactions   Bactrim [sulfamethoxazole-trimethoprim]    "it makes me do weird things"      Medication List    TAKE these medications   HYDROcodone-acetaminophen 5-325 MG tablet Commonly known as: Norco Take 1-2 tablets by mouth every 6 (six) hours as needed.   lisinopril 10 MG tablet Commonly known as: ZESTRIL Take 10 mg by mouth daily.

## 2019-10-11 ENCOUNTER — Ambulatory Visit (HOSPITAL_COMMUNITY)
Admission: RE | Admit: 2019-10-11 | Discharge: 2019-10-11 | Disposition: A | Discharge status: Home / Self Care | Payer: Medicare Other | Source: Ambulatory Visit | Attending: Nurse Practitioner | Admitting: Nurse Practitioner

## 2019-10-11 ENCOUNTER — Other Ambulatory Visit: Payer: Self-pay

## 2019-10-11 ENCOUNTER — Other Ambulatory Visit (HOSPITAL_COMMUNITY): Payer: Self-pay | Admitting: Nurse Practitioner

## 2019-10-11 DIAGNOSIS — C641 Malignant neoplasm of right kidney, except renal pelvis: Secondary | ICD-10-CM

## 2020-03-30 ENCOUNTER — Ambulatory Visit (HOSPITAL_COMMUNITY)
Admission: RE | Admit: 2020-03-30 | Discharge: 2020-03-30 | Disposition: A | Discharge status: Home / Self Care | Payer: Medicare Other | Source: Ambulatory Visit | Attending: Urology | Admitting: Urology

## 2020-03-30 ENCOUNTER — Other Ambulatory Visit: Payer: Self-pay

## 2020-03-30 ENCOUNTER — Other Ambulatory Visit (HOSPITAL_COMMUNITY): Payer: Self-pay | Admitting: Urology

## 2020-03-30 DIAGNOSIS — C641 Malignant neoplasm of right kidney, except renal pelvis: Secondary | ICD-10-CM | POA: Diagnosis not present

## 2020-07-06 ENCOUNTER — Telehealth: Payer: Self-pay | Admitting: Urology

## 2020-07-06 NOTE — Telephone Encounter (Signed)
Pt scheduled for October and she wants to know if she needs a Ct before that appointment. She was former [facility] pt.

## 2020-07-07 ENCOUNTER — Other Ambulatory Visit: Payer: Self-pay

## 2020-07-07 DIAGNOSIS — C641 Malignant neoplasm of right kidney, except renal pelvis: Secondary | ICD-10-CM

## 2020-07-07 NOTE — Telephone Encounter (Signed)
Pt only needs chest xray and cmp per last OV note at [facility]. Message left for pt. Orders placed.

## 2020-09-28 ENCOUNTER — Ambulatory Visit (HOSPITAL_COMMUNITY)
Admission: RE | Admit: 2020-09-28 | Discharge: 2020-09-28 | Disposition: A | Discharge status: Home / Self Care | Payer: Medicare Other | Source: Ambulatory Visit | Attending: Urology | Admitting: Urology

## 2020-09-28 ENCOUNTER — Other Ambulatory Visit: Payer: Self-pay

## 2020-09-28 DIAGNOSIS — C641 Malignant neoplasm of right kidney, except renal pelvis: Secondary | ICD-10-CM | POA: Diagnosis present

## 2020-10-05 ENCOUNTER — Other Ambulatory Visit: Payer: Self-pay

## 2020-10-05 ENCOUNTER — Other Ambulatory Visit: Payer: Medicare Other

## 2020-10-05 DIAGNOSIS — C641 Malignant neoplasm of right kidney, except renal pelvis: Secondary | ICD-10-CM

## 2020-10-05 NOTE — Addendum Note (Signed)
Addended by: Pollyann Kennedy F on: 10/05/2020 11:07 AM   Modules accepted: Orders

## 2020-10-06 LAB — COMPREHENSIVE METABOLIC PANEL
ALT: 12 IU/L (ref 0–32)
AST: 13 IU/L (ref 0–40)
Albumin/Globulin Ratio: 1.8 (ref 1.2–2.2)
Albumin: 4.6 g/dL (ref 3.8–4.8)
Alkaline Phosphatase: 64 IU/L (ref 44–121)
BUN/Creatinine Ratio: 22 (ref 12–28)
BUN: 21 mg/dL (ref 8–27)
Bilirubin Total: 0.5 mg/dL (ref 0.0–1.2)
CO2: 24 mmol/L (ref 20–29)
Calcium: 10 mg/dL (ref 8.7–10.3)
Chloride: 106 mmol/L (ref 96–106)
Creatinine, Ser: 0.97 mg/dL (ref 0.57–1.00)
GFR calc Af Amer: 69 mL/min/{1.73_m2} (ref >59)
GFR calc non Af Amer: 60 mL/min/{1.73_m2} (ref >59)
Globulin, Total: 2.6 g/dL (ref 1.5–4.5)
Glucose: 96 mg/dL (ref 65–99)
Potassium: 4.7 mmol/L (ref 3.5–5.2)
Sodium: 141 mmol/L (ref 134–144)
Total Protein: 7.2 g/dL (ref 6.0–8.5)

## 2020-10-09 ENCOUNTER — Telehealth: Payer: Self-pay

## 2020-10-09 NOTE — Telephone Encounter (Signed)
-----   Message from Cleon Gustin, MD sent at 10/07/2020  9:41 AM EDT ----- normal ----- Message ----- From: Valentina Lucks, LPN Sent: 48/68/8520   8:20 AM EDT To: Cleon Gustin, MD  Pls review.

## 2020-10-09 NOTE — Telephone Encounter (Signed)
Pt made aware of normal cxr

## 2020-10-09 NOTE — Progress Notes (Signed)
Letter sent.

## 2020-10-10 ENCOUNTER — Telehealth: Payer: Self-pay

## 2020-10-10 NOTE — Telephone Encounter (Signed)
-----   Message from Cleon Gustin, MD sent at 10/07/2020  9:41 AM EDT ----- normal ----- Message ----- From: Valentina Lucks, LPN Sent: 26/94/8546   8:20 AM EDT To: Cleon Gustin, MD  Pls review.

## 2020-10-11 NOTE — Telephone Encounter (Signed)
-----   Message from Cleon Gustin, MD sent at 10/07/2020  9:41 AM EDT ----- normal ----- Message ----- From: Valentina Lucks, LPN Sent: 91/69/4503   8:20 AM EDT To: Cleon Gustin, MD  Pls review.

## 2020-10-11 NOTE — Telephone Encounter (Signed)
Pt was notified of blood work result by Gibson Ramp, RN.

## 2020-10-12 ENCOUNTER — Other Ambulatory Visit: Payer: Self-pay

## 2020-10-12 ENCOUNTER — Ambulatory Visit (INDEPENDENT_AMBULATORY_CARE_PROVIDER_SITE_OTHER): Payer: Medicare Other | Admitting: Urology

## 2020-10-12 VITALS — BP 147/72 | HR 66 | Temp 98.1°F | Ht 64.0 in | Wt 232.0 lb

## 2020-10-12 DIAGNOSIS — C641 Malignant neoplasm of right kidney, except renal pelvis: Secondary | ICD-10-CM

## 2020-10-12 LAB — URINALYSIS, ROUTINE W REFLEX MICROSCOPIC
Bilirubin, UA: NEGATIVE
Glucose, UA: NEGATIVE
Ketones, UA: NEGATIVE
Nitrite, UA: NEGATIVE
Protein,UA: NEGATIVE
Specific Gravity, UA: >1.03 — ABNORMAL HIGH (ref 1.005–1.030)
Urobilinogen, Ur: 1 mg/dL (ref 0.2–1.0)
pH, UA: 6 (ref 5.0–7.5)

## 2020-10-12 LAB — MICROSCOPIC EXAMINATION: Renal Epithel, UA: NONE SEEN /hpf

## 2020-10-12 NOTE — Progress Notes (Signed)
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
## 2020-10-18 ENCOUNTER — Encounter: Payer: Self-pay | Admitting: Urology

## 2020-10-18 NOTE — Patient Instructions (Signed)
Kidney Cancer  Kidney cancer is an abnormal growth of cells in one or both kidneys. The kidneys filter waste from your blood and produce urine. Kidney cancer may spread to other parts of your body. This type of cancer may also be called renal cell carcinoma. What are the causes? The cause of this condition is not always known. In some cases, abnormal changes to genes (genetic mutations) can cause cells to form cancer. What increases the risk? You may be more likely to develop kidney cancer if you:  Are over age 60. The risk increases with age.  Have a family history of kidney cancer.  Are of African-American, Native American, or Native [place] descent.  Smoke.  Are female.  Are obese.  Have high blood pressure (hypertension).  Have advanced kidney disease, especially if you need long-term dialysis.  Have certain conditions that are passed from parent to child (inherited), such as von Hippel-Lindau disease, tuberous sclerosis, or hereditary papillary renal carcinoma.  Have been exposed to certain chemicals. What are the signs or symptoms? In the early stages, kidney cancer does not cause symptoms. As the cancer grows, symptoms may include:  Blood in the urine.  Pain in the upper back or abdomen, just below the rib cage. You may feel pain on one or both sides of the body.  Fatigue.  Unexplained weight loss.  Fever. How is this diagnosed? This condition may be diagnosed based on:  Your symptoms and medical history.  A physical exam.  Blood and urine tests.  X-rays.  Imaging tests, such as CT scans, MRIs, and PET scans.  Having dye injected into your blood through an IV, and then having X-rays taken of: ? Your kidneys and the rest of the organs involved in making and storing urine (intravenous pyelogram). ? Your blood vessels (angiogram).  Removal and testing of a kidney tissue sample (biopsy). Your cancer will be assessed (staged), based on how severe it is and how  much it has spread. How is this treated? Treatment depends on the type and stage of the cancer. Treatment may include one or more of the following:  Surgery. This may include surgery to remove: ? Just the tumor (nephron-sparing surgery). ? The entire kidney (nephrectomy). ? The kidney, some of the surrounding healthy tissue, nearby lymph nodes, and the adrenal gland in certain cases (radical nephrectomy).  Medicines that kill cancer cells (chemotherapy).  High-energy rays that kill cancer cells (radiation therapy).  Targeted therapy. This targets specific parts of cancer cells and the area around them to block the growth and the spread of the cancer. Targeted therapy can help to limit the damage to healthy cells.  Medicines that help your body's disease-fighting system (immune system) fight cancer cells (immunotherapy).  Freezing cancer cells using gas or liquid that is delivered through a needle (cryoablation).  Destroying cancer cells using high-energy radio waves that are delivered through a needle-like probe (radiofrequency ablation).  A procedure to block the artery that supplies blood to the tumor, which kills the cancer cells (embolization). Follow these instructions at home: Eating and drinking  Some of your treatments might affect your appetite and your ability to chew and swallow. If you are having problems eating, or if you do not have an appetite, meet with a diet and nutrition specialist (dietitian).  If you have side effects that affect eating, it may help to: ? Eat smaller meals and snacks often. ? Drink high-nutrition and high-calorie shakes or supplements. ? Eat bland and soft   foods that are easy to eat. ? Not eat foods that are hot, spicy, or hard to swallow. Lifestyle  Do not drink alcohol.  Do not use any products that contain nicotine or tobacco, such as cigarettes and e-cigarettes. If you need help quitting, ask your health care provider. General  instructions   Take over-the-counter and prescription medicines only as told by your health care provider. This includes vitamins, supplements, and herbal products.  Consider joining a support group to help you cope with the stress of having kidney cancer.  Work with your health care provider to manage any side effects of treatment.  Keep all follow-up visits as told by your health care provider. This is important. Where to find more information  American Cancer Society: https://www.cancer.org  National Cancer Institute (NCI): https://www.cancer.gov Contact a health care provider if you:  Notice that you bruise or bleed easily.  Are losing weight without trying.  Have new or increased fatigue or weakness. Get help right away if you have:  Blood in your urine.  A sudden increase in pain.  A fever.  Shortness of breath.  Chest pain.  Yellow skin or whites of your eyes (jaundice). Summary  Kidney cancer is an abnormal growth of cells (tumor) in one or both kidneys. Tumors may spread to other parts of your body.  In the early stages, kidney cancer does not cause symptoms. As the cancer grows, symptoms may include blood in the urine, pain in the upper back or abdomen, unexplained weight loss, fatigue, and fever.  Treatment depends on the type and stage of the cancer. It may include surgery to remove the tumor, procedures and medicines to kill the cancer cells, or medicines to help your body fight cancer cells. This information is not intended to replace advice given to you by your health care provider. Make sure you discuss any questions you have with your health care provider. Document Revised: 12/31/2017 Document Reviewed: 12/28/2017 Elsevier Patient Education  2020 [employer]  

## 2021-03-29 ENCOUNTER — Ambulatory Visit (HOSPITAL_COMMUNITY)
Admission: RE | Admit: 2021-03-29 | Discharge: 2021-03-29 | Disposition: A | Discharge status: Home / Self Care | Payer: Medicare Other | Source: Ambulatory Visit | Attending: Urology | Admitting: Urology

## 2021-03-29 ENCOUNTER — Other Ambulatory Visit: Payer: Self-pay

## 2021-03-29 DIAGNOSIS — C641 Malignant neoplasm of right kidney, except renal pelvis: Secondary | ICD-10-CM | POA: Diagnosis not present

## 2021-04-04 ENCOUNTER — Other Ambulatory Visit: Payer: Medicare Other

## 2021-04-04 ENCOUNTER — Other Ambulatory Visit: Payer: Self-pay

## 2021-04-04 DIAGNOSIS — C641 Malignant neoplasm of right kidney, except renal pelvis: Secondary | ICD-10-CM

## 2021-04-05 ENCOUNTER — Other Ambulatory Visit: Payer: 59

## 2021-04-05 LAB — COMPREHENSIVE METABOLIC PANEL
ALT: 14 IU/L (ref 0–32)
AST: 15 IU/L (ref 0–40)
Albumin/Globulin Ratio: 1.7 (ref 1.2–2.2)
Albumin: 4.5 g/dL (ref 3.8–4.8)
Alkaline Phosphatase: 66 IU/L (ref 44–121)
BUN/Creatinine Ratio: 16 (ref 12–28)
BUN: 18 mg/dL (ref 8–27)
Bilirubin Total: 0.7 mg/dL (ref 0.0–1.2)
CO2: 21 mmol/L (ref 20–29)
Calcium: 9.4 mg/dL (ref 8.7–10.3)
Chloride: 101 mmol/L (ref 96–106)
Creatinine, Ser: 1.16 mg/dL — ABNORMAL HIGH (ref 0.57–1.00)
Globulin, Total: 2.6 g/dL (ref 1.5–4.5)
Glucose: 94 mg/dL (ref 65–99)
Potassium: 4.2 mmol/L (ref 3.5–5.2)
Sodium: 138 mmol/L (ref 134–144)
Total Protein: 7.1 g/dL (ref 6.0–8.5)
eGFR: 51 mL/min/{1.73_m2} — ABNORMAL LOW (ref >59)

## 2021-04-10 ENCOUNTER — Telehealth: Payer: Self-pay

## 2021-04-10 NOTE — Telephone Encounter (Signed)
Attempted to call to give results of both procedure and lab. Left message to call back.

## 2021-04-10 NOTE — Progress Notes (Signed)
Attempted to call pt. Left message to call back

## 2021-04-10 NOTE — Telephone Encounter (Signed)
-----   Message from Cleon Gustin, MD sent at 04/10/2021  9:07 AM EDT ----- normal ----- Message ----- From: Valentina Lucks, LPN Sent: 5/83/4621   7:59 AM EDT To: Cleon Gustin, MD  Pls review.

## 2021-04-11 ENCOUNTER — Encounter: Payer: Self-pay | Admitting: Urology

## 2021-04-11 ENCOUNTER — Ambulatory Visit (INDEPENDENT_AMBULATORY_CARE_PROVIDER_SITE_OTHER): Payer: Medicare Other | Admitting: Urology

## 2021-04-11 ENCOUNTER — Other Ambulatory Visit: Payer: Self-pay

## 2021-04-11 VITALS — BP 197/66 | HR 45 | Temp 98.5°F | Ht 65.0 in | Wt 232.0 lb

## 2021-04-11 DIAGNOSIS — C641 Malignant neoplasm of right kidney, except renal pelvis: Secondary | ICD-10-CM | POA: Diagnosis not present

## 2021-04-11 LAB — URINALYSIS, ROUTINE W REFLEX MICROSCOPIC
Bilirubin, UA: NEGATIVE
Glucose, UA: NEGATIVE
Ketones, UA: NEGATIVE
Nitrite, UA: NEGATIVE
Protein,UA: NEGATIVE
Specific Gravity, UA: 1.02 (ref 1.005–1.030)
Urobilinogen, Ur: 0.2 mg/dL (ref 0.2–1.0)
pH, UA: 7 (ref 5.0–7.5)

## 2021-04-11 LAB — MICROSCOPIC EXAMINATION: RBC, Urine: NONE SEEN /hpf (ref 0–2)

## 2021-04-11 NOTE — Progress Notes (Signed)
04/11/2021 10:11 AM   Laurie Savage Jul 19, 1951 [id number]  Referring provider: Maylon Peppers, MD 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 Creatinine 1.16. CMP normal. CXR normal. No complaints   PMH: Past Medical History:  Diagnosis Date  . Arthritis   . Esophageal stricture    stretched about once a year  . Hyperlipidemia   . Hypertension     Surgical History: Past Surgical History:  Procedure Laterality Date  . ABDOMINAL HYSTERECTOMY     partial  . APPENDECTOMY    . CESAREAN SECTION    . removal naval     due to hernia [phone]  . ROBOT ASSISTED LAPAROSCOPIC NEPHRECTOMY Right 07/09/2019   Procedure: XI ROBOTIC ASSISTED LAPAROSCOPIC NEPHRECTOMY;  Surgeon: Laurie Gustin, MD;  Location: [facility];  Service: Urology;  Laterality: Right;  3 HRS    Home Medications:  Allergies as of 04/11/2021      Reactions   Bactrim [sulfamethoxazole-trimethoprim]    "it makes me do weird things"      Medication List       Accurate as of April 11, 2021 10:11 AM. If you have any questions, ask your nurse or doctor.        STOP taking these medications   HYDROcodone-acetaminophen 5-325 MG tablet Commonly known as: Norco Stopped by: Nicolette Bang, MD     TAKE these medications   amLODipine 10 MG tablet Commonly known as: NORVASC Take 10 mg by mouth daily.   lisinopril 20 MG tablet Commonly known as: ZESTRIL Take 20 mg by mouth daily.   rosuvastatin 10 MG tablet Commonly known as: CRESTOR Take 10 mg by mouth daily.       Allergies:  Allergies  Allergen Reactions  . Bactrim [Sulfamethoxazole-Trimethoprim]     "it makes me do weird things"    Family History: No family history on file.  Social History:  reports that she has never smoked. She has never used smokeless tobacco. She reports previous alcohol use. She reports that she  does not use drugs.  ROS: All other review of systems were reviewed and are negative except what is noted above in HPI  Physical Exam: BP (!) 197/66   Pulse (!) 45   Temp 98.5 F (36.9 C)   Ht 5\' 5"  (1.651 m)   Wt 232 lb (105.2 kg)   BMI 38.61 kg/m   Constitutional:  Alert and oriented, No acute distress. HEENT: Laurie Savage AT, moist mucus membranes.  Trachea midline, no masses. Cardiovascular: No clubbing, cyanosis, or edema. Respiratory: Normal respiratory effort, no increased work of breathing. GI: Abdomen is soft, nontender, nondistended, no abdominal masses GU: No CVA tenderness.  Lymph: No cervical or inguinal lymphadenopathy. Skin: No rashes, bruises or suspicious lesions. Neurologic: Grossly intact, no focal deficits, moving all 4 extremities. Psychiatric: Normal mood and affect.  Laboratory Data: Lab Results  Component Value Date   WBC 6.1 07/07/2019   HGB 11.4 (L) 07/10/2019   HCT 37.2 07/10/2019   MCV 99.8 07/07/2019   PLT 319 07/07/2019    Lab Results  Component Value Date   CREATININE 1.16 (H) 04/04/2021    No results found for: PSA  No results found for: TESTOSTERONE  No results found for: HGBA1C  Urinalysis    Component Value Date/Time   APPEARANCEUR Clear 10/12/2020 1022   GLUCOSEU Negative 10/12/2020 1022  BILIRUBINUR Negative 10/12/2020 1022   PROTEINUR Negative 10/12/2020 1022   NITRITE Negative 10/12/2020 1022   LEUKOCYTESUR Trace (A) 10/12/2020 1022    Lab Results  Component Value Date   LABMICR See below: 10/12/2020   WBCUA 6-10 (A) 10/12/2020   LABEPIT 0-10 10/12/2020   BACTERIA Few 10/12/2020    Pertinent Imaging: CXR: Images reviewed and discussed with the patient No results found for this or any previous visit.  No results found for this or any previous visit.  No results found for this or any previous visit.  No results found for this or any previous visit.  No results found for this or any previous visit.  No results  found for this or any previous visit.  No results found for this or any previous visit.  No results found for this or any previous visit.   Assessment & Plan:    1. Renal cell carcinoma, right (HCC) -RTC 6 months with CXR and CMP - Urinalysis, Routine w reflex microscopic   No follow-ups on file.  Nicolette Bang, MD  [facility] [place]

## 2021-04-11 NOTE — Progress Notes (Signed)

## 2021-04-11 NOTE — Patient Instructions (Signed)

## 2021-04-12 ENCOUNTER — Telehealth: Payer: Self-pay

## 2021-04-12 ENCOUNTER — Ambulatory Visit: Payer: 59 | Admitting: Urology

## 2021-04-12 NOTE — Progress Notes (Signed)
Pt called and notified

## 2021-04-12 NOTE — Telephone Encounter (Signed)
Pt called and notified

## 2021-04-12 NOTE — Telephone Encounter (Signed)
-----   Message from Cleon Gustin, MD sent at 04/10/2021  9:07 AM EDT ----- normal ----- Message ----- From: Valentina Lucks, LPN Sent: 06/30/6282   7:59 AM EDT To: Cleon Gustin, MD  Pls review.

## 2021-04-12 NOTE — Telephone Encounter (Signed)
-----   Message from Cleon Gustin, MD sent at 04/10/2021  9:07 AM EDT ----- normal ----- Message ----- From: Valentina Lucks, LPN Sent: 12/29/107  32:35 AM EDT To: Cleon Gustin, MD  Pls review.

## 2021-09-27 ENCOUNTER — Ambulatory Visit (HOSPITAL_COMMUNITY)
Admission: RE | Admit: 2021-09-27 | Discharge: 2021-09-27 | Disposition: A | Discharge status: Home / Self Care | Payer: Medicare Other | Source: Ambulatory Visit | Attending: Urology | Admitting: Urology

## 2021-09-27 DIAGNOSIS — C641 Malignant neoplasm of right kidney, except renal pelvis: Secondary | ICD-10-CM | POA: Insufficient documentation

## 2021-10-10 ENCOUNTER — Other Ambulatory Visit: Payer: Medicare Other

## 2021-10-10 ENCOUNTER — Other Ambulatory Visit: Payer: Self-pay

## 2021-10-10 DIAGNOSIS — C641 Malignant neoplasm of right kidney, except renal pelvis: Secondary | ICD-10-CM

## 2021-10-11 LAB — COMPREHENSIVE METABOLIC PANEL
ALT: 12 IU/L (ref 0–32)
AST: 15 IU/L (ref 0–40)
Albumin/Globulin Ratio: 1.7 (ref 1.2–2.2)
Albumin: 4.4 g/dL (ref 3.8–4.8)
Alkaline Phosphatase: 61 IU/L (ref 44–121)
BUN/Creatinine Ratio: 18 (ref 12–28)
BUN: 20 mg/dL (ref 8–27)
Bilirubin Total: 0.4 mg/dL (ref 0.0–1.2)
CO2: 23 mmol/L (ref 20–29)
Calcium: 9.8 mg/dL (ref 8.7–10.3)
Chloride: 106 mmol/L (ref 96–106)
Creatinine, Ser: 1.13 mg/dL — ABNORMAL HIGH (ref 0.57–1.00)
Globulin, Total: 2.6 g/dL (ref 1.5–4.5)
Glucose: 101 mg/dL — ABNORMAL HIGH (ref 70–99)
Potassium: 5.2 mmol/L (ref 3.5–5.2)
Sodium: 144 mmol/L (ref 134–144)
Total Protein: 7 g/dL (ref 6.0–8.5)
eGFR: 53 mL/min/{1.73_m2} — ABNORMAL LOW (ref >59)

## 2021-10-17 ENCOUNTER — Ambulatory Visit: Payer: 59 | Admitting: Urology

## 2021-10-23 ENCOUNTER — Other Ambulatory Visit: Payer: Self-pay

## 2021-10-23 ENCOUNTER — Ambulatory Visit (INDEPENDENT_AMBULATORY_CARE_PROVIDER_SITE_OTHER): Payer: Medicare Other | Admitting: Urology

## 2021-10-23 ENCOUNTER — Encounter: Payer: Self-pay | Admitting: Urology

## 2021-10-23 DIAGNOSIS — C641 Malignant neoplasm of right kidney, except renal pelvis: Secondary | ICD-10-CM | POA: Diagnosis not present

## 2021-10-23 NOTE — Progress Notes (Signed)
10/23/2021 3:35 PM   Eddie North 12-Mar-1951 [id number]  Referring provider: Maylon Peppers, MD [address] [place],  [place] [address]  Patient location: home Physician location: office I connected with  Kelissa Merlin on 10/23/21 by a video enabled telemedicine application and verified that I am speaking with the correct person using two identifiers.   I discussed the limitations of evaluation and management by telemedicine. The patient expressed understanding and agreed to proceed.    Followup right RCC  HPI: Ms Bezold is a 70yo here for followup for right RCC. She underwent nephrectomy in 06/2019 with a T1a RCC. Creatinine 1.13. CXR from 10/7 shows no evidence of metastatic disease. He has stable nocturia 1x. No urinary urgency or incontinence. No other significant LUTS. No other complaints today   PMH: Past Medical History:  Diagnosis Date   Arthritis    Esophageal stricture    stretched about once a year   Hyperlipidemia    Hypertension     Surgical History: Past Surgical History:  Procedure Laterality Date   ABDOMINAL HYSTERECTOMY     partial   APPENDECTOMY     CESAREAN SECTION     removal naval     due to hernia 07-1980   ROBOT ASSISTED LAPAROSCOPIC NEPHRECTOMY Right 07/09/2019   Procedure: XI ROBOTIC ASSISTED LAPAROSCOPIC NEPHRECTOMY;  Surgeon: Cleon Gustin, MD;  Location: [facility];  Service: Urology;  Laterality: Right;  3 HRS    Home Medications:  Allergies as of 10/23/2021       Reactions   Bactrim [sulfamethoxazole-trimethoprim]    "it makes me do weird things"        Medication List        Accurate as of October 23, 2021  3:35 PM. If you have any questions, ask your nurse or doctor.          amLODipine 10 MG tablet Commonly known as: NORVASC Take 10 mg by mouth daily.   lisinopril 20 MG tablet Commonly known as: ZESTRIL Take 20 mg by mouth daily.   rosuvastatin 10 MG tablet Commonly known as: CRESTOR Take 10 mg by  mouth daily.        Allergies:  Allergies  Allergen Reactions   Bactrim [Sulfamethoxazole-Trimethoprim]     "it makes me do weird things"    Family History: No family history on file.  Social History:  reports that she has never smoked. She has never used smokeless tobacco. She reports that she does not currently use alcohol. She reports that she does not use drugs.  ROS: All other review of systems were reviewed and are negative except what is noted above in HPI   Laboratory Data: Lab Results  Component Value Date   WBC 6.1 07/07/2019   HGB 11.4 (L) 07/10/2019   HCT 37.2 07/10/2019   MCV 99.8 07/07/2019   PLT 319 07/07/2019    Lab Results  Component Value Date   CREATININE 1.13 (H) 10/10/2021    No results found for: PSA  No results found for: TESTOSTERONE  No results found for: HGBA1C  Urinalysis    Component Value Date/Time   APPEARANCEUR Clear 04/11/2021 0922   GLUCOSEU Negative 04/11/2021 0922   BILIRUBINUR Negative 04/11/2021 0922   PROTEINUR Negative 04/11/2021 0922   NITRITE Negative 04/11/2021 0922   LEUKOCYTESUR Trace (A) 04/11/2021 0922    Lab Results  Component Value Date   LABMICR See below: 04/11/2021   WBCUA 0-5 04/11/2021   LABEPIT 0-10  04/11/2021   BACTERIA Few (A) 04/11/2021    Pertinent Imaging: CXR 10/7: Images reviewed and discussed with the patient No results found for this or any previous visit.  No results found for this or any previous visit.  No results found for this or any previous visit.  No results found for this or any previous visit.  No results found for this or any previous visit.  No results found for this or any previous visit.  No results found for this or any previous visit.  No results found for this or any previous visit.   Assessment & Plan:    1. Renal cell carcinoma, right (HCC) RTC 6 months with CXR and CMP   No follow-ups on file.  Nicolette Bang, MD  [facility]  [place]

## 2021-10-23 NOTE — Patient Instructions (Signed)
Kidney Cancer  Kidney cancer is an abnormal growth of cells in one or both kidneys. The kidneys filter waste from your blood and produce urine. Kidney cancer may spread to other parts of your body. This type of cancer may also be calledrenal cell carcinoma. What are the causes? The cause of this condition is not always known. In some cases, abnormal changes to genes (genetic mutations) can cause cells to form cancer. What increases the risk? You may be more likely to develop kidney cancer if you: Are over age 60. The risk increases with age. Have a family history of kidney cancer. Are of African-American, Native American, or Native [place]an descent. Smoke. Are female. Are obese. Have high blood pressure (hypertension). Have advanced kidney disease, especially if you need long-term dialysis. Have certain conditions that are passed from parent to child (inherited), such as von Hippel-Lindau disease, tuberous sclerosis, or hereditary papillary renal carcinoma. Have been exposed to certain chemicals. What are the signs or symptoms? In the early stages, kidney cancer does not cause symptoms. As the cancer grows, symptoms may include: Blood in the urine. Pain in the upper back or abdomen, just below the rib cage. You may feel pain on one or both sides of the body. Fatigue. Unexplained weight loss. Fever. How is this diagnosed? This condition may be diagnosed based on: Your symptoms and medical history. A physical exam. Blood and urine tests. X-rays. Imaging tests, such as CT scans, MRIs, and PET scans. Having dye injected into your blood through an IV, and then having X-rays taken of: Your kidneys and the rest of the organs involved in making and storing urine (intravenous pyelogram). Your blood vessels (angiogram). Removal and testing of a kidney tissue sample (biopsy). Your cancer will be assessed (staged), based on how severe it is and how much it has spread. How is this  treated? Treatment depends on the type and stage of the cancer. Treatment may include one or more of the following: Surgery. This may include surgery to remove: Just the tumor (nephron-sparing surgery). The entire kidney (nephrectomy). The kidney, some of the surrounding healthy tissue, nearby lymph nodes, and the adrenal gland in certain cases (radical nephrectomy). Medicines that kill cancer cells (chemotherapy). High-energy rays that kill cancer cells (radiation therapy). Targeted therapy. This targets specific parts of cancer cells and the area around them to block the growth and the spread of the cancer. Targeted therapy can help to limit the damage to healthy cells. Medicines that help your body's disease-fighting system (immune system) fight cancer cells (immunotherapy). Freezing cancer cells using gas or liquid that is delivered through a needle (cryoablation). Destroying cancer cells using high-energy radio waves that are delivered through a needle-like probe (radiofrequency ablation). A procedure to block the artery that supplies blood to the tumor, which kills the cancer cells (embolization). Follow these instructions at home: Eating and drinking Some of your treatments might affect your appetite and your ability to chew and swallow. If you are having problems eating, or if you do not have an appetite, meet with a diet and nutrition specialist (dietitian). If you have side effects that affect eating, it may help to: Eat smaller meals and snacks often. Drink high-nutrition and high-calorie shakes or supplements. Eat bland and soft foods that are easy to eat. Not eat foods that are hot, spicy, or hard to swallow. Lifestyle Do not drink alcohol. Do not use any products that contain nicotine or tobacco, such as cigarettes and e-cigarettes. If you need help   quitting, ask your health care provider. General instructions  Take over-the-counter and prescription medicines only as told by  your health care provider. This includes vitamins, supplements, and herbal products. Consider joining a support group to help you cope with the stress of having kidney cancer. Work with your health care provider to manage any side effects of treatment. Keep all follow-up visits as told by your health care provider. This is important.  Where to find more information American Cancer Society: https://www.cancer.org National Cancer Institute (NCI): https://www.cancer.gov Contact a health care provider if you: Notice that you bruise or bleed easily. Are losing weight without trying. Have new or increased fatigue or weakness. Get help right away if you have: Blood in your urine. A sudden increase in pain. A fever. Shortness of breath. Chest pain. Yellow skin or whites of your eyes (jaundice). Summary Kidney cancer is an abnormal growth of cells (tumor) in one or both kidneys. Tumors may spread to other parts of your body. In the early stages, kidney cancer does not cause symptoms. As the cancer grows, symptoms may include blood in the urine, pain in the upper back or abdomen, unexplained weight loss, fatigue, and fever. Treatment depends on the type and stage of the cancer. It may include surgery to remove the tumor, procedures and medicines to kill the cancer cells, or medicines to help your body fight cancer cells. This information is not intended to replace advice given to you by your health care provider. Make sure you discuss any questions you have with your healthcare provider. Document Revised: 12/31/2017 Document Reviewed: 12/28/2017 Elsevier Patient Education  2022 Elsevier Inc.  

## 2022-04-18 ENCOUNTER — Other Ambulatory Visit: Payer: Self-pay | Admitting: Physician Assistant

## 2022-04-18 ENCOUNTER — Ambulatory Visit (HOSPITAL_COMMUNITY)
Admission: RE | Admit: 2022-04-18 | Discharge: 2022-04-18 | Disposition: A | Discharge status: Home / Self Care | Payer: Medicare Other | Source: Ambulatory Visit | Attending: Urology | Admitting: Urology

## 2022-04-18 ENCOUNTER — Other Ambulatory Visit: Payer: Medicare Other

## 2022-04-18 ENCOUNTER — Telehealth: Payer: Self-pay

## 2022-04-18 DIAGNOSIS — C641 Malignant neoplasm of right kidney, except renal pelvis: Secondary | ICD-10-CM | POA: Insufficient documentation

## 2022-04-18 DIAGNOSIS — R8281 Pyuria: Secondary | ICD-10-CM

## 2022-04-18 LAB — MICROSCOPIC EXAMINATION

## 2022-04-18 LAB — URINALYSIS, ROUTINE W REFLEX MICROSCOPIC
Bilirubin, UA: NEGATIVE
Glucose, UA: NEGATIVE
Ketones, UA: NEGATIVE
Nitrite, UA: NEGATIVE
Protein,UA: NEGATIVE
Specific Gravity, UA: 1.025 (ref 1.005–1.030)
Urobilinogen, Ur: 0.2 mg/dL (ref 0.2–1.0)
pH, UA: 6 (ref 5.0–7.5)

## 2022-04-18 MED ORDER — NITROFURANTOIN MONOHYD MACRO 100 MG PO CAPS: 100.0000 mg | ORAL_CAPSULE | Freq: Two times a day (BID) | ORAL | 0 refills | Status: AC

## 2022-04-18 NOTE — Progress Notes (Signed)
Pt arrived for lab appt ?  ?

## 2022-04-18 NOTE — Telephone Encounter (Signed)
-----   Message from Reynaldo Minium, [place] sent at 04/18/2022 10:01 AM EDT ----- ?Please let pt know I'm sending her urine for culture and will send in Rx for antibx due to hr sxs. Her UA looks like very mild UTI, but cx will confirm. ? ?

## 2022-04-18 NOTE — Telephone Encounter (Signed)
Pt aware, and voiced understanding ?

## 2022-04-19 LAB — COMPREHENSIVE METABOLIC PANEL
ALT: 10 IU/L (ref 0–32)
AST: 14 IU/L (ref 0–40)
Albumin/Globulin Ratio: 1.8 (ref 1.2–2.2)
Albumin: 4.4 g/dL (ref 3.8–4.8)
Alkaline Phosphatase: 72 IU/L (ref 44–121)
BUN/Creatinine Ratio: 23 (ref 12–28)
BUN: 25 mg/dL (ref 8–27)
Bilirubin Total: 0.4 mg/dL (ref 0.0–1.2)
CO2: 25 mmol/L (ref 20–29)
Calcium: 9.8 mg/dL (ref 8.7–10.3)
Chloride: 106 mmol/L (ref 96–106)
Creatinine, Ser: 1.11 mg/dL — ABNORMAL HIGH (ref 0.57–1.00)
Globulin, Total: 2.4 g/dL (ref 1.5–4.5)
Glucose: 93 mg/dL (ref 70–99)
Potassium: 4.5 mmol/L (ref 3.5–5.2)
Sodium: 143 mmol/L (ref 134–144)
Total Protein: 6.8 g/dL (ref 6.0–8.5)
eGFR: 54 mL/min/{1.73_m2} — ABNORMAL LOW (ref >59)

## 2022-04-20 LAB — URINE CULTURE

## 2022-04-23 ENCOUNTER — Telehealth: Payer: 59 | Admitting: Urology

## 2022-04-26 ENCOUNTER — Ambulatory Visit (INDEPENDENT_AMBULATORY_CARE_PROVIDER_SITE_OTHER): Payer: Medicare Other | Admitting: Urology

## 2022-04-26 ENCOUNTER — Encounter: Payer: Self-pay | Admitting: Urology

## 2022-04-26 VITALS — BP 179/71 | HR 53

## 2022-04-26 DIAGNOSIS — R350 Frequency of micturition: Secondary | ICD-10-CM | POA: Diagnosis not present

## 2022-04-26 DIAGNOSIS — C641 Malignant neoplasm of right kidney, except renal pelvis: Secondary | ICD-10-CM

## 2022-04-26 DIAGNOSIS — R8281 Pyuria: Secondary | ICD-10-CM

## 2022-04-26 DIAGNOSIS — R300 Dysuria: Secondary | ICD-10-CM | POA: Diagnosis not present

## 2022-04-26 DIAGNOSIS — R3915 Urgency of urination: Secondary | ICD-10-CM

## 2022-04-26 DIAGNOSIS — Z85528 Personal history of other malignant neoplasm of kidney: Secondary | ICD-10-CM

## 2022-04-26 LAB — MICROSCOPIC EXAMINATION
RBC, Urine: NONE SEEN /hpf (ref 0–2)
Renal Epithel, UA: NONE SEEN /hpf

## 2022-04-26 LAB — URINALYSIS, ROUTINE W REFLEX MICROSCOPIC
Bilirubin, UA: NEGATIVE
Glucose, UA: NEGATIVE
Ketones, UA: NEGATIVE
Nitrite, UA: NEGATIVE
Protein,UA: NEGATIVE
RBC, UA: NEGATIVE
Specific Gravity, UA: 1.02 (ref 1.005–1.030)
Urobilinogen, Ur: 0.2 mg/dL (ref 0.2–1.0)
pH, UA: 7.5 (ref 5.0–7.5)

## 2022-04-26 MED ORDER — DOXYCYCLINE HYCLATE 100 MG PO CAPS: 100.0000 mg | ORAL_CAPSULE | Freq: Two times a day (BID) | ORAL | 0 refills | Status: AC

## 2022-04-26 NOTE — Progress Notes (Signed)
? ?04/26/2022 ?1:00 PM  ? ?Laurie Savage ?1951/03/30 ?[id number] ? ?Referring provider: Maylon Peppers, MD ?[address] ?[address] ?[place],  [place] [address] ? ?Followup RCC  ? ? ?HPI: ?[name] is a 71yo here for followup for right RCC s/p nephrectomy in 2020 and new dysuria. CMP and CXR normal. She denies any flank pain. She has new onset urinary frequency, urgency, and dysuria for the past 3 days. UA is concerning for infection. No history of recurrent UTIs. No hematuria. No other complaints today ? ? ?PMH: ?Past Medical History:  ?Diagnosis Date  ? Arthritis   ? Esophageal stricture   ? stretched about once a year  ? Hyperlipidemia   ? Hypertension   ? ? ?Surgical History: ?Past Surgical History:  ?Procedure Laterality Date  ? ABDOMINAL HYSTERECTOMY    ? partial  ? APPENDECTOMY    ? CESAREAN SECTION    ? removal naval    ? due to hernia [phone]  ? ROBOT ASSISTED LAPAROSCOPIC NEPHRECTOMY Right 07/09/2019  ? Procedure: XI ROBOTIC ASSISTED LAPAROSCOPIC NEPHRECTOMY;  Surgeon: Laurie Gustin, MD;  Location: [facility];  Service: Urology;  Laterality: Right;  3 HRS  ? ? ?Home Medications:  ?Allergies as of 04/26/2022   ? ?   Reactions  ? Bactrim [sulfamethoxazole-trimethoprim]   ? "it makes me do weird things"  ? ?  ? ?  ?Medication List  ?  ? ?  ? Accurate as of Apr 26, 2022  1:00 PM. If you have any questions, ask your nurse or doctor.  ?  ?  ? ?  ? ?amLODipine 10 MG tablet ?Commonly known as: NORVASC ?Take 10 mg by mouth daily. ?  ?doxycycline 100 MG capsule ?Commonly known as: VIBRAMYCIN ?Take 1 capsule (100 mg total) by mouth every 12 (twelve) hours. ?  ?lisinopril 20 MG tablet ?Commonly known as: ZESTRIL ?Take 20 mg by mouth daily. ?  ?nitrofurantoin (macrocrystal-monohydrate) 100 MG capsule ?Commonly known as: MACROBID ?Take 1 capsule (100 mg total) by mouth every 12 (twelve) hours. ?  ?rosuvastatin 10 MG tablet ?Commonly known as: CRESTOR ?Take 10 mg by mouth daily. ?  ? ?  ? ? ?Allergies:  ?Allergies  ?Allergen  Reactions  ? Bactrim [Sulfamethoxazole-Trimethoprim]   ?  "it makes me do weird things"  ? ? ?Family History: ?No family history on file. ? ?Social History:  reports that she has never smoked. She has never used smokeless tobacco. She reports that she does not currently use alcohol. She reports that she does not use drugs. ? ?ROS: ?All other review of systems were reviewed and are negative except what is noted above in HPI ? ?Physical Exam: ?BP (!) 179/71   Pulse (!) 53   ?Constitutional:  Alert and oriented, No acute distress. ?HEENT: [place] AT, moist mucus membranes.  Trachea midline, no masses. ?Cardiovascular: No clubbing, cyanosis, or edema. ?Respiratory: Normal respiratory effort, no increased work of breathing. ?GI: Abdomen is soft, nontender, nondistended, no abdominal masses ?GU: No CVA tenderness.  ?Lymph: No cervical or inguinal lymphadenopathy. ?Skin: No rashes, bruises or suspicious lesions. ?Neurologic: Grossly intact, no focal deficits, moving all 4 extremities. ?Psychiatric: Normal mood and affect. ? ?Laboratory Data: ?Lab Results  ?Component Value Date  ? WBC 6.1 07/07/2019  ? HGB 11.4 (L) 07/10/2019  ? HCT 37.2 07/10/2019  ? MCV 99.8 07/07/2019  ? PLT 319 07/07/2019  ? ? ?Lab Results  ?Component Value Date  ? CREATININE 1.11 (H) 04/18/2022  ? ? ?  No results found for: PSA ? ?No results found for: TESTOSTERONE ? ?No results found for: HGBA1C ? ?Urinalysis ?   ?Component Value Date/Time  ? APPEARANCEUR Clear 04/18/2022 0927  ? GLUCOSEU Negative 04/18/2022 0927  ? BILIRUBINUR Negative 04/18/2022 0927  ? PROTEINUR Negative 04/18/2022 0927  ? NITRITE Negative 04/18/2022 0927  ? LEUKOCYTESUR Trace (A) 04/18/2022 6060  ? ? ?Lab Results  ?Component Value Date  ? LABMICR See below: 04/18/2022  ? WBCUA 6-10 (A) 04/18/2022  ? LABEPIT 0-10 04/18/2022  ? BACTERIA Few (A) 04/18/2022  ? ? ?Pertinent Imaging: ?Chest xray 03/29/2022: Images reviewed and discussed with the patient  ?No results found for this or any  previous visit. ? ?No results found for this or any previous visit. ? ?No results found for this or any previous visit. ? ?No results found for this or any previous visit. ? ?No results found for this or any previous visit. ? ?No results found for this or any previous visit. ? ?No results found for this or any previous visit. ? ?No results found for this or any previous visit. ? ? ?Assessment & Plan:   ? ?1. Pyuria ?-Urine for culture ?-doxycycline '100mg'$  BID for 7 days ?- Urinalysis, Routine w reflex microscopic ?- Urine culture ? ?2. Renal cell carcinoma, right ([place]) ?-RCT 6 months with CMP and CXR ? ? ?No follow-ups on file. ? ?Laurie Bang, MD ? ?[facility] [place] ? ? ?

## 2022-04-28 LAB — URINE CULTURE

## 2022-07-09 ENCOUNTER — Ambulatory Visit (INDEPENDENT_AMBULATORY_CARE_PROVIDER_SITE_OTHER): Payer: Medicare Other | Admitting: Urology

## 2022-07-09 VITALS — BP 186/65 | HR 69

## 2022-07-09 DIAGNOSIS — C641 Malignant neoplasm of right kidney, except renal pelvis: Secondary | ICD-10-CM

## 2022-07-09 DIAGNOSIS — R7989 Other specified abnormal findings of blood chemistry: Secondary | ICD-10-CM

## 2022-07-09 DIAGNOSIS — R8281 Pyuria: Secondary | ICD-10-CM

## 2022-07-09 DIAGNOSIS — Z85528 Personal history of other malignant neoplasm of kidney: Secondary | ICD-10-CM | POA: Diagnosis not present

## 2022-07-09 LAB — URINALYSIS, ROUTINE W REFLEX MICROSCOPIC
Bilirubin, UA: NEGATIVE
Glucose, UA: NEGATIVE
Ketones, UA: NEGATIVE
Nitrite, UA: NEGATIVE
Protein,UA: NEGATIVE
RBC, UA: NEGATIVE
Specific Gravity, UA: 1.02 (ref 1.005–1.030)
Urobilinogen, Ur: 0.2 mg/dL (ref 0.2–1.0)
pH, UA: 7 (ref 5.0–7.5)

## 2022-07-09 LAB — MICROSCOPIC EXAMINATION
RBC, Urine: NONE SEEN /hpf (ref 0–2)
Renal Epithel, UA: NONE SEEN /hpf

## 2022-07-09 NOTE — Progress Notes (Signed)
07/09/2022 10:28 AM   Laurie Savage 09/25/1951 [id number]  Referring provider: Maylon Peppers, MD [address] [place],  [place] [address]  Elevated creatinine   HPI: Laurie Savage is a 71yo here for evaluation of elevated creatinine. Her creatinine was 1.8 on 7/11 and then decreased to 1.1 with GFR of 54. She drinks 80oz of water daily. She has a hx of right RCC and underwent right radical nephrectomy in 2020.    PMH: Past Medical History:  Diagnosis Date   Arthritis    Esophageal stricture    stretched about once a year   Hyperlipidemia    Hypertension     Surgical History: Past Surgical History:  Procedure Laterality Date   ABDOMINAL HYSTERECTOMY     partial   APPENDECTOMY     CESAREAN SECTION     removal naval     due to hernia 07-1980   ROBOT ASSISTED LAPAROSCOPIC NEPHRECTOMY Right 07/09/2019   Procedure: XI ROBOTIC ASSISTED LAPAROSCOPIC NEPHRECTOMY;  Surgeon: Cleon Gustin, MD;  Location: [facility];  Service: Urology;  Laterality: Right;  3 HRS    Home Medications:  Allergies as of 07/09/2022       Reactions   Bactrim [sulfamethoxazole-trimethoprim]    "it makes me do weird things"        Medication List        Accurate as of July 09, 2022 10:28 AM. If you have any questions, ask your nurse or doctor.          amLODipine 10 MG tablet Commonly known as: NORVASC Take 10 mg by mouth daily.   doxycycline 100 MG capsule Commonly known as: VIBRAMYCIN Take 1 capsule (100 mg total) by mouth every 12 (twelve) hours.   lisinopril 20 MG tablet Commonly known as: ZESTRIL Take 20 mg by mouth daily.   nitrofurantoin (macrocrystal-monohydrate) 100 MG capsule Commonly known as: MACROBID Take 1 capsule (100 mg total) by mouth every 12 (twelve) hours.   rosuvastatin 10 MG tablet Commonly known as: CRESTOR Take 10 mg by mouth daily.        Allergies:  Allergies  Allergen Reactions   Bactrim [Sulfamethoxazole-Trimethoprim]     "it  makes me do weird things"    Family History: No family history on file.  Social History:  reports that she has never smoked. She has never used smokeless tobacco. She reports that she does not currently use alcohol. She reports that she does not use drugs.  ROS: All other review of systems were reviewed and are negative except what is noted above in HPI  Physical Exam: BP (!) 186/65   Pulse 69   Constitutional:  Alert and oriented, No acute distress. HEENT:  AT, moist mucus membranes.  Trachea midline, no masses. Cardiovascular: No clubbing, cyanosis, or edema. Respiratory: Normal respiratory effort, no increased work of breathing. GI: Abdomen is soft, nontender, nondistended, no abdominal masses GU: No CVA tenderness.  Lymph: No cervical or inguinal lymphadenopathy. Skin: No rashes, bruises or suspicious lesions. Neurologic: Grossly intact, no focal deficits, moving all 4 extremities. Psychiatric: Normal mood and affect.  Laboratory Data: Lab Results  Component Value Date   WBC 6.1 07/07/2019   HGB 11.4 (L) 07/10/2019   HCT 37.2 07/10/2019   MCV 99.8 07/07/2019   PLT 319 07/07/2019    Lab Results  Component Value Date   CREATININE 1.11 (H) 04/18/2022    No results found for: "PSA"  No results found for: "TESTOSTERONE"  No  results found for: "HGBA1C"  Urinalysis    Component Value Date/Time   APPEARANCEUR Clear 04/26/2022 1320   GLUCOSEU Negative 04/26/2022 1320   BILIRUBINUR Negative 04/26/2022 1320   PROTEINUR Negative 04/26/2022 1320   NITRITE Negative 04/26/2022 1320   LEUKOCYTESUR Trace (A) 04/26/2022 1320    Lab Results  Component Value Date   LABMICR See below: 04/26/2022   WBCUA 0-5 04/26/2022   LABEPIT 0-10 04/26/2022   BACTERIA Few 04/26/2022    Pertinent Imaging:  No results found for this or any previous visit.  No results found for this or any previous visit.  No results found for this or any previous visit.  No results found for  this or any previous visit.  No results found for this or any previous visit.  No results found for this or any previous visit.  No results found for this or any previous visit.  No results found for this or any previous visit.   Assessment & Plan:    1. Hx of renal cell carcinoma -Patient reassured that is it normal to have a decreased GFR with a solitary kidney. She was instructed to continue to drink 60-80 oz of water daily.  - Urinalysis, Routine w reflex microscopic   No follow-ups on file.  Nicolette Bang, MD  [facility] [place]

## 2022-07-11 LAB — URINE CULTURE

## 2022-07-16 ENCOUNTER — Encounter: Payer: Self-pay | Admitting: Urology

## 2022-07-16 NOTE — Patient Instructions (Signed)
Kidney Cancer  Kidney cancer is an abnormal growth of cells in one or both kidneys. The kidneys filter waste from your blood and produce urine. Kidney cancer may spread to other parts of your body. This type of cancer may also be called renal cell carcinoma. What are the causes? The cause of this condition is not always known. In some cases, abnormal changes to genes (genetic mutations) can cause cells to form cancer. What increases the risk? You may be more likely to develop kidney cancer if you: Are over age 70. The risk increases with age. Have a family history of kidney cancer. Are of African-American, Native American, or Native [place] descent. Smoke. Are female. Are obese. Have high blood pressure (hypertension). Have advanced kidney disease, especially if you need long-term dialysis. Have certain conditions that are passed from parent to child (inherited), such as von Hippel-Lindau disease, tuberous sclerosis, or hereditary papillary renal carcinoma. Have been exposed to certain chemicals. What are the signs or symptoms? In the early stages, kidney cancer does not cause symptoms. As the cancer grows, symptoms may include: Blood in the urine. Pain in the upper back or abdomen, just below the rib cage. You may feel pain on one or both sides of the body. Fatigue. Unexplained weight loss. Fever. How is this diagnosed? This condition may be diagnosed based on: Your symptoms and medical history. A physical exam. Blood and urine tests. X-rays. Imaging tests, such as CT scans, MRIs, and PET scans. Having dye injected into your blood through an IV, and then having X-rays taken of: Your kidneys and the rest of the organs involved in making and storing urine (intravenous pyelogram). Your blood vessels (angiogram). Removal and testing of a kidney tissue sample (biopsy). Your cancer will be assessed (staged), based on how severe it is and how much it has spread. How is this  treated? Treatment depends on the type and stage of the cancer. Treatment may include one or more of the following: Surgery. This may include surgery to remove: Just the tumor (nephron-sparing surgery). The entire kidney (nephrectomy). The kidney, some of the surrounding healthy tissue, nearby lymph nodes, and the adrenal gland in certain cases (radical nephrectomy). Medicines that kill cancer cells (chemotherapy). High-energy rays that kill cancer cells (radiation therapy). Targeted therapy. This targets specific parts of cancer cells and the area around them to block the growth and the spread of the cancer. Targeted therapy can help to limit the damage to healthy cells. Medicines that help your body's disease-fighting system (immune system) fight cancer cells (immunotherapy). Freezing cancer cells using gas or liquid that is delivered through a needle (cryoablation). Destroying cancer cells using high-energy radio waves that are delivered through a needle-like probe (radiofrequency ablation). A procedure to block the artery that supplies blood to the tumor, which kills the cancer cells (embolization). Follow these instructions at home: Eating and drinking Some of your treatments might affect your appetite and your ability to chew and swallow. If you are having problems eating, or if you do not have an appetite, meet with a diet and nutrition specialist (dietitian). If you have side effects that affect eating, it may help to: Eat smaller meals and snacks often. Drink high-nutrition and high-calorie shakes or supplements. Eat bland and soft foods that are easy to eat. Not eat foods that are hot, spicy, or hard to swallow. Lifestyle Do not drink alcohol. Do not use any products that contain nicotine or tobacco, such as cigarettes and e-cigarettes. If you need  help quitting, ask your health care provider. General instructions  Take over-the-counter and prescription medicines only as told by  your health care provider. This includes vitamins, supplements, and herbal products. Consider joining a support group to help you cope with the stress of having kidney cancer. Work with your health care provider to manage any side effects of treatment. Keep all follow-up visits as told by your health care provider. This is important. Where to find more information [employer] Society: https://www.cancer.[place]): https://www.cancer.gov Contact a health care provider if you: Notice that you bruise or bleed easily. Are losing weight without trying. Have new or increased fatigue or weakness. Get help right away if you have: Blood in your urine. A sudden increase in pain. A fever. Shortness of breath. Chest pain. Yellow skin or whites of your eyes (jaundice). Summary Kidney cancer is an abnormal growth of cells (tumor) in one or both kidneys. Tumors may spread to other parts of your body. In the early stages, kidney cancer does not cause symptoms. As the cancer grows, symptoms may include blood in the urine, pain in the upper back or abdomen, unexplained weight loss, fatigue, and fever. Treatment depends on the type and stage of the cancer. It may include surgery to remove the tumor, procedures and medicines to kill the cancer cells, or medicines to help your body fight cancer cells. This information is not intended to replace advice given to you by your health care provider. Make sure you discuss any questions you have with your health care provider. Document Revised: 07/31/2021 Document Reviewed: 07/31/2021 [employer] Patient Education  [employer].

## 2022-10-22 ENCOUNTER — Other Ambulatory Visit: Payer: 59

## 2022-10-29 ENCOUNTER — Ambulatory Visit: Payer: 59 | Admitting: Urology

## 2022-10-31 ENCOUNTER — Other Ambulatory Visit: Payer: 59

## 2022-11-01 ENCOUNTER — Ambulatory Visit: Payer: 59 | Admitting: Urology

## 2022-11-16 IMAGING — DX DG CHEST 1V
1 series · 1 of 1 positions shown · non-contrast
Comparison: Chest radiograph 09/27/2021.

CLINICAL DATA: History of renal cell carcinoma.

EXAM:
CHEST  1 VIEW

[chest pa]
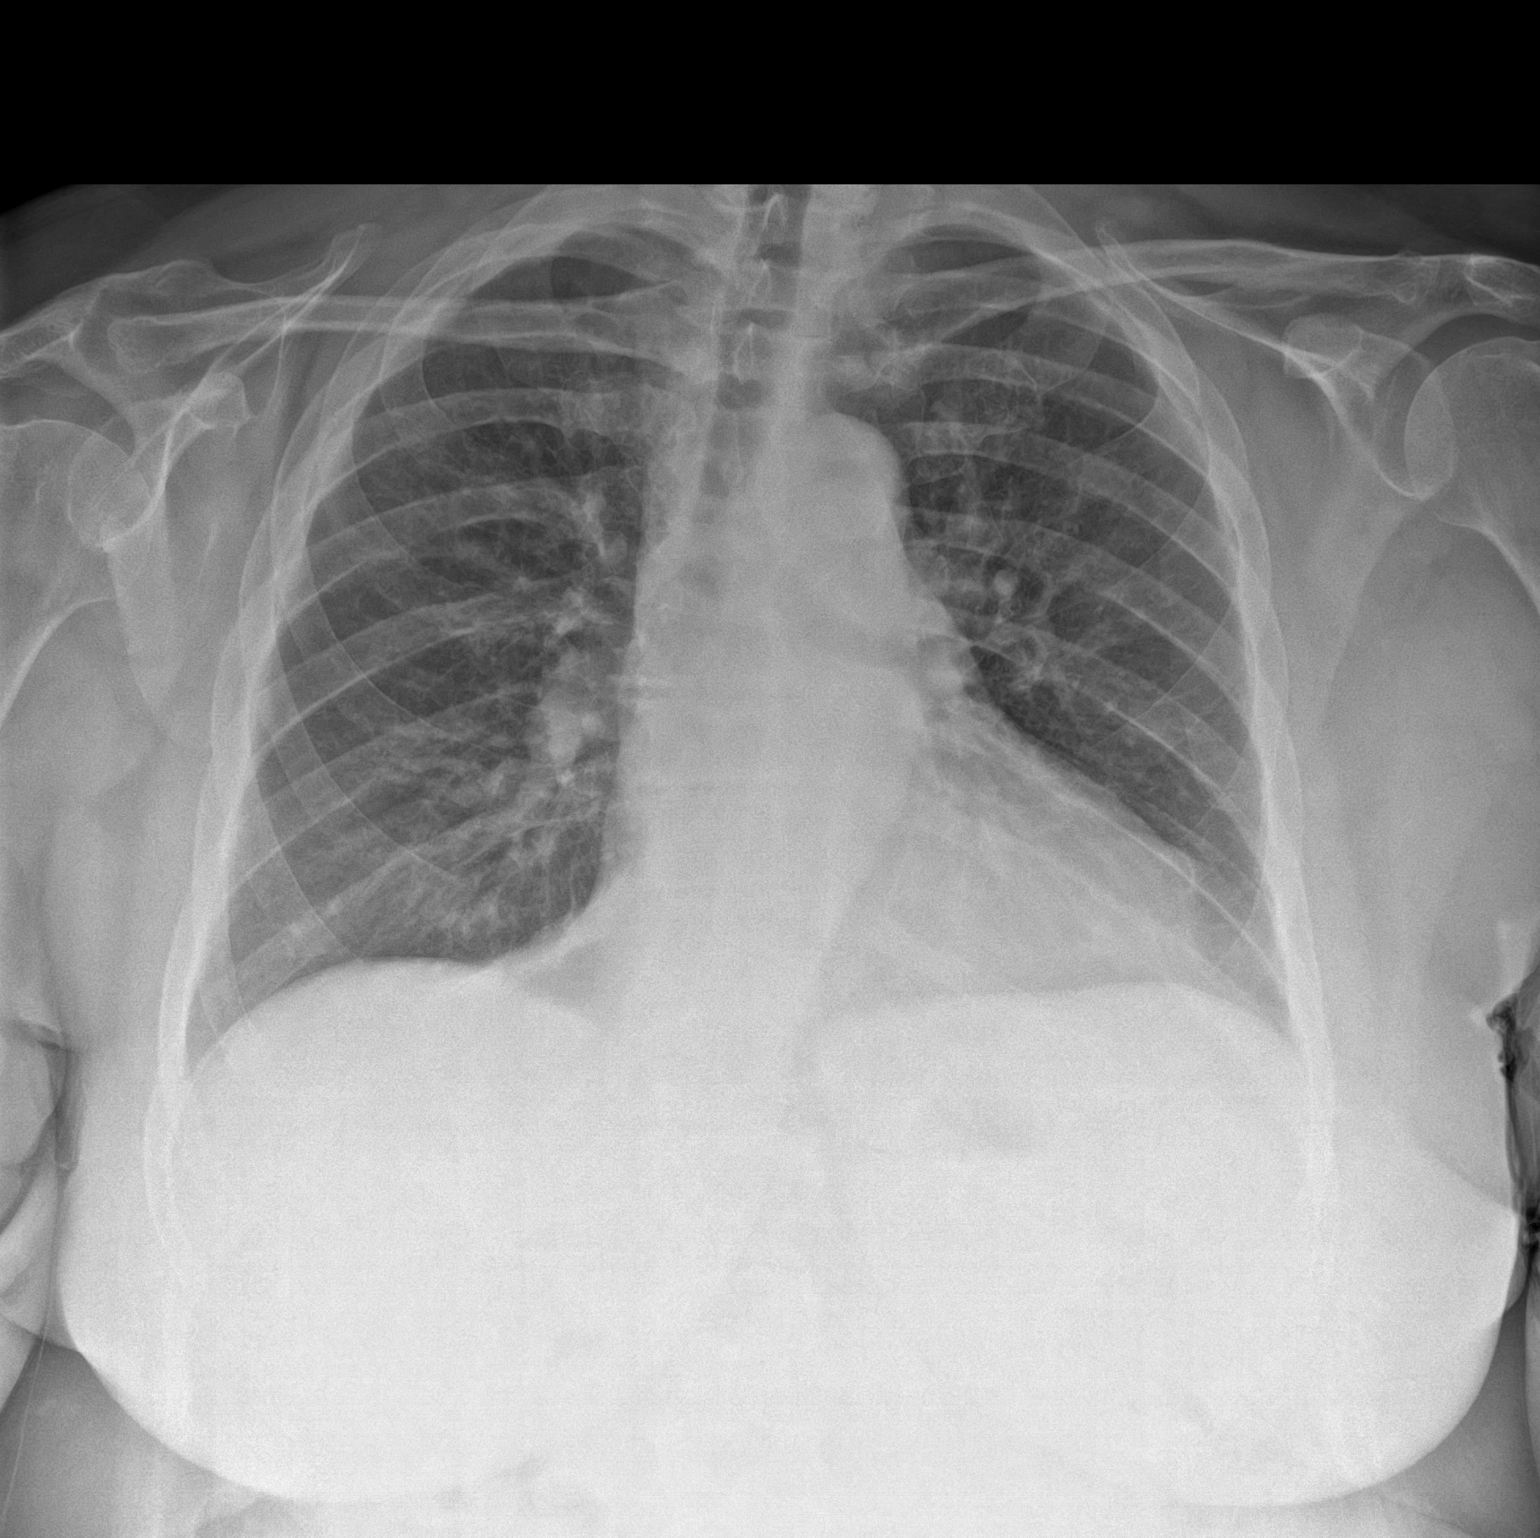

[1 of 1 positions shown; findings below may reference images not displayed]

FINDINGS: Stable cardiac and mediastinal contours. No consolidative pulmonary
opacities. No pleural effusion or pneumothorax.
IMPRESSION: No active disease.
